# Patient Record
Sex: Female | Born: 1975 | Race: White | Hispanic: No | Smoking: Current every day smoker
Health system: Southern US, Community
[De-identification: ages and names within clinical notes are randomized; demographics above are authoritative.]

## PROBLEM LIST (undated history)

## (undated) ENCOUNTER — Inpatient Hospital Stay (HOSPITAL_COMMUNITY): Payer: Medicaid Other

## (undated) ENCOUNTER — Inpatient Hospital Stay (HOSPITAL_COMMUNITY): Payer: Self-pay

## (undated) DIAGNOSIS — O0990 Supervision of high risk pregnancy, unspecified, unspecified trimester: Secondary | ICD-10-CM

## (undated) DIAGNOSIS — O24419 Gestational diabetes mellitus in pregnancy, unspecified control: Secondary | ICD-10-CM

## (undated) DIAGNOSIS — O139 Gestational [pregnancy-induced] hypertension without significant proteinuria, unspecified trimester: Secondary | ICD-10-CM

## (undated) DIAGNOSIS — F329 Major depressive disorder, single episode, unspecified: Secondary | ICD-10-CM

## (undated) DIAGNOSIS — O1490 Unspecified pre-eclampsia, unspecified trimester: Secondary | ICD-10-CM

## (undated) DIAGNOSIS — K219 Gastro-esophageal reflux disease without esophagitis: Secondary | ICD-10-CM

## (undated) DIAGNOSIS — B999 Unspecified infectious disease: Secondary | ICD-10-CM

## (undated) DIAGNOSIS — F32A Depression, unspecified: Secondary | ICD-10-CM

## (undated) HISTORY — PX: DILATION AND CURETTAGE OF UTERUS: SHX78

## (undated) HISTORY — PX: THERAPEUTIC ABORTION: SHX798

## (undated) HISTORY — PX: WISDOM TOOTH EXTRACTION: SHX21

---

## 2011-11-18 ENCOUNTER — Encounter (HOSPITAL_COMMUNITY): Payer: Self-pay | Admitting: *Deleted

## 2011-11-18 ENCOUNTER — Emergency Department (HOSPITAL_COMMUNITY)
Admission: EM | Admit: 2011-11-18 | Discharge: 2011-11-19 | Disposition: A | Discharge status: Home / Self Care | Payer: Medicaid Other | Attending: Emergency Medicine | Admitting: Emergency Medicine

## 2011-11-18 ENCOUNTER — Emergency Department (HOSPITAL_COMMUNITY): Payer: Medicaid Other

## 2011-11-18 DIAGNOSIS — N898 Other specified noninflammatory disorders of vagina: Secondary | ICD-10-CM | POA: Insufficient documentation

## 2011-11-18 DIAGNOSIS — R1031 Right lower quadrant pain: Secondary | ICD-10-CM | POA: Insufficient documentation

## 2011-11-18 DIAGNOSIS — N852 Hypertrophy of uterus: Secondary | ICD-10-CM | POA: Insufficient documentation

## 2011-11-18 DIAGNOSIS — O200 Threatened abortion: Secondary | ICD-10-CM

## 2011-11-18 HISTORY — DX: Gestational diabetes mellitus in pregnancy, unspecified control: O24.419

## 2011-11-18 HISTORY — DX: Supervision of high risk pregnancy, unspecified, unspecified trimester: O09.90

## 2011-11-18 HISTORY — DX: Unspecified pre-eclampsia, unspecified trimester: O14.90

## 2011-11-18 LAB — CBC
Hemoglobin: 11.1 g/dL — ABNORMAL LOW (ref 12.0–15.0)
MCHC: 35.7 g/dL (ref 30.0–36.0)
Platelets: 185 10*3/uL (ref 150–400)

## 2011-11-18 LAB — DIFFERENTIAL
Basophils Absolute: 0 10*3/uL (ref 0.0–0.1)
Basophils Relative: 0 % (ref 0–1)
Eosinophils Absolute: 0.3 10*3/uL (ref 0.0–0.7)
Monocytes Relative: 7 % (ref 3–12)
Neutro Abs: 4.8 10*3/uL (ref 1.7–7.7)
Neutrophils Relative %: 62 % (ref 43–77)

## 2011-11-18 LAB — ABO/RH: ABO/RH(D): O POS

## 2011-11-18 LAB — HCG, QUANTITATIVE, PREGNANCY: hCG, Beta Chain, Quant, S: 37441 m[IU]/mL — ABNORMAL HIGH (ref <5)

## 2011-11-18 LAB — BASIC METABOLIC PANEL
BUN: 7 mg/dL (ref 6–23)
Calcium: 9.6 mg/dL (ref 8.4–10.5)
GFR calc Af Amer: >90 mL/min (ref >90)
GFR calc non Af Amer: >90 mL/min (ref >90)
Potassium: 3.3 mEq/L — ABNORMAL LOW (ref 3.5–5.1)
Sodium: 135 mEq/L (ref 135–145)

## 2011-11-18 NOTE — ED Provider Notes (Signed)
History     CSN: [id number] Arrival date & time 11/18/11  2112 First MD Initiated Contact with Patient 11/18/11 2201      Chief Complaint  Patient presents with  . Abdominal Pain    HPI Patient presents to the emergency room with complaints of right lower abdominal pain. This is her seventh pregnancy. She has 2 children at home. The patient has had 2 prior miscarriages and one elective abortion. Patient states she started having sharp abdominal pain this evening. She denies any diarrhea, fever or vaginal bleeding. Patient states the pain is sharp and stabbing. It seems to come and go. She had normal bowel movement this morning. She has not tried taking anything for the pain. Patient states she has not seen an OB/GYN doctor here and has not established care with a physician in this area. LMP feb 8th.   Past Medical History  Diagnosis Date  . Gestational diabetes   . Preeclampsia   . High-risk pregnancy     Past Surgical History  Procedure Date  . Cesarean section     History reviewed. No pertinent family history.  History  Substance Use Topics  . Smoking status: Former Smoker    Quit date: 08/21/2011  . Smokeless tobacco: Not on file  . Alcohol Use: No    OB History    Grav Para Term Preterm Abortions TAB SAB Ect Mult Living   7 2              Review of Systems  All other systems reviewed and are negative.    Allergies  Flagyl  Home Medications   Current Outpatient Rx  Name Route Sig Dispense Refill  . ADULT MULTIVITAMIN W/MINERALS CH Oral Take 1 tablet by mouth daily.      BP 114/67  Pulse 87  Temp(Src) 99 F (37.2 C) (Oral)  Resp 16  SpO2 98%  LMP 08/21/2011  Physical Exam  Nursing note and vitals reviewed. Constitutional: She appears well-developed and well-nourished. No distress.  HENT:  Head: Normocephalic and atraumatic.  Right Ear: External ear normal.  Left Ear: External ear normal.  Eyes: Conjunctivae are normal. Right eye exhibits no  discharge. Left eye exhibits no discharge. No scleral icterus.  Neck: Neck supple. No tracheal deviation present.  Cardiovascular: Normal rate, regular rhythm and intact distal pulses.   Pulmonary/Chest: Effort normal and breath sounds normal. No stridor. No respiratory distress. She has no wheezes. She has no rales.  Abdominal: Soft. Bowel sounds are normal. She exhibits no distension. There is no tenderness. There is no rebound and no guarding.  Genitourinary: There is no rash or tenderness on the right labia. There is no rash or tenderness on the left labia. Uterus is enlarged. Cervix exhibits no discharge. Right adnexum displays no mass. Left adnexum displays no mass. No erythema or tenderness around the vagina. Bleeding: small amount of bleeding after swab used. No signs of injury around the vagina. No vaginal discharge found.  Musculoskeletal: She exhibits no edema and no tenderness.  Neurological: She is alert. She has normal strength. No sensory deficit. Cranial nerve deficit:  no gross defecits noted. She exhibits normal muscle tone. She displays no seizure activity. Coordination normal.  Skin: Skin is warm and dry. No rash noted.  Psychiatric: She has a normal mood and affect.    ED Course  Procedures (including critical care time)   Labs Reviewed  BASIC METABOLIC PANEL  CBC  DIFFERENTIAL  ABO/RH  HCG, QUANTITATIVE, PREGNANCY  No results found.   No diagnosis found.    MDM  Doubt appendicits.  Pt with benign exam.  Will check [place] to assess for IUP vs threatened ab versus ectopic.  Pt remains stable.       Celene Kras, MD 11/18/11 [phone]

## 2011-11-18 NOTE — ED Notes (Signed)
Reports onset of RLQ abd pain - interm - since approx 1900 this PM - describes as "sharp"; reports is currently 12 wks preg - P 2, G 7, Ab 4 (3 miscarriages, 1 abortion); denies vaginal bleeding nor urinary symptoms;  Reports "clear" vaginal d/c; however, reports this is "normal" for her

## 2011-11-18 NOTE — ED Notes (Signed)
Assisted MD with pelvic exam - culture for GC/Chlamydia sent to minilab; pt tolerated procedure well

## 2011-11-18 NOTE — ED Notes (Signed)
[redacted] weeks pregnant, no prenatal visits yet, no OB, c/o abd pain, onset 2 hrs ago, (denies: diarrhea, fever, bleeding, spotting or other sx), pt alert, NAD, calm, interactive, skin W&D, resps e/u, resps e/u, grimacing. Pinpoints pain to RLQ. Temp 99.0 oral. "Comes and goes, sharp and stabbing". Last ate 1730. Last BM this am (normal). No meds PTA. Still has appendix.

## 2011-11-19 ENCOUNTER — Emergency Department (HOSPITAL_COMMUNITY): Payer: Medicaid Other

## 2011-11-19 NOTE — Discharge Instructions (Signed)
Return to the ED with any concerns including vaginal bleeding and soaking more than one pad per hour, worsening pain, fainting, decreased level of alertness/lethargy, or any other alarming symptoms

## 2011-11-19 NOTE — ED Notes (Signed)
Radiology called to see why [place] has not resulted.  Dondra Spry in radiology is passing message on to radiologist.

## 2011-11-19 NOTE — ED Provider Notes (Signed)
Pt signed out to me at change of shift, she had her pelvic ultrasound- but no results as of yet.  Have called and radiology will read for [place].    4:02 AM  Ultrasound result obtained- shows live IUP c/w LMP.  Pt discharged.    Ethelda Chick, MD 11/19/11 [phone]

## 2011-12-25 LAB — OB RESULTS CONSOLE ABO/RH: RH Type: POSITIVE

## 2011-12-25 LAB — OB RESULTS CONSOLE HEPATITIS B SURFACE ANTIGEN: Hepatitis B Surface Ag: NEGATIVE

## 2011-12-25 LAB — OB RESULTS CONSOLE RUBELLA ANTIBODY, IGM: Rubella: IMMUNE

## 2011-12-25 LAB — OB RESULTS CONSOLE ANTIBODY SCREEN: Antibody Screen: NEGATIVE

## 2011-12-25 LAB — OB RESULTS CONSOLE HIV ANTIBODY (ROUTINE TESTING): HIV: NONREACTIVE

## 2012-05-03 ENCOUNTER — Encounter (HOSPITAL_COMMUNITY): Payer: Self-pay | Admitting: *Deleted

## 2012-05-03 ENCOUNTER — Inpatient Hospital Stay (HOSPITAL_COMMUNITY)
Admission: AD | Admit: 2012-05-03 | Discharge: 2012-05-03 | Disposition: A | Discharge status: Hospice | Payer: Medicaid Other | Source: Ambulatory Visit | Attending: Obstetrics and Gynecology | Admitting: Obstetrics and Gynecology

## 2012-05-03 DIAGNOSIS — O479 False labor, unspecified: Secondary | ICD-10-CM | POA: Insufficient documentation

## 2012-05-03 HISTORY — DX: Gastro-esophageal reflux disease without esophagitis: K21.9

## 2012-05-03 MED ORDER — ZOLPIDEM TARTRATE 5 MG PO TABS
5.0000 mg | ORAL_TABLET | Freq: Once | ORAL | Status: AC
  Administered 2012-05-03: 5 mg via ORAL
  Filled 2012-05-03: qty 1

## 2012-05-03 NOTE — MAU Note (Signed)
Pt states is hving contractions that are painful-is a repeat C/S-scheduled for 05-16-12

## 2012-05-04 NOTE — Progress Notes (Signed)
FHT from 10-22 reviewed.  Reactive NST, no significant decels, irreg ctx

## 2012-05-12 ENCOUNTER — Encounter (HOSPITAL_COMMUNITY): Payer: Self-pay

## 2012-05-13 ENCOUNTER — Encounter (HOSPITAL_COMMUNITY)
Admission: RE | Admit: 2012-05-13 | Discharge: 2012-05-13 | Disposition: A | Discharge status: Home / Self Care | Payer: Medicaid Other | Source: Ambulatory Visit | Attending: Obstetrics and Gynecology | Admitting: Obstetrics and Gynecology

## 2012-05-13 ENCOUNTER — Encounter (HOSPITAL_COMMUNITY): Payer: Self-pay

## 2012-05-13 LAB — CBC
HCT: 30.8 % — ABNORMAL LOW (ref 36.0–46.0)
Hemoglobin: 10.2 g/dL — ABNORMAL LOW (ref 12.0–15.0)
MCHC: 33.1 g/dL (ref 30.0–36.0)
MCV: 89.8 fL (ref 78.0–100.0)
RDW: 13.7 % (ref 11.5–15.5)

## 2012-05-13 LAB — TYPE AND SCREEN: Antibody Screen: NEGATIVE

## 2012-05-13 NOTE — Patient Instructions (Addendum)
20 Dana [name]  05/13/2012   Your procedure is scheduled on:  05/16/12  Enter through the Main Entrance of [facility] at 1100 AM.  Pick up the phone at the desk and dial [phone].   Call this number if you have problems the morning of surgery: [phone]   Remember:   Do not eat food:After Midnight.  Do not drink clear liquids: After Midnight.  Take these medicines the morning of surgery with A SIP OF WATER: Protonix   Do not wear jewelry, make-up or nail polish.  Do not wear lotions, powders, or perfumes. You may wear deodorant.  Do not shave 48 hours prior to surgery.  Do not bring valuables to the hospital.  Contacts, dentures or bridgework may not be worn into surgery.  Leave suitcase in the car. After surgery it may be brought to your room.  For patients admitted to the hospital, checkout time is 11:00 AM the day of discharge.   Patients discharged the day of surgery will not be allowed to drive home.  Name and phone number of your driver: NA  Special Instructions: Shower using CHG 2 nights before surgery and the night before surgery.  If you shower the day of surgery use CHG.  Use special wash - you have one bottle of CHG for all showers.  You should use approximately 1/3 of the bottle for each shower.   Please read over the following fact sheets that you were given: MRSA Information 20 Dana [name]  05/13/2012   Your procedure is scheduled on:  11/4  Enter through the Main Entrance of [facility] at 1100  Pick up the phone at the desk and dial [phone].   Call this number if you have problems the morning of surgery: [phone]   Remember:   Do not eat food:After Midnight.  Do not drink clear liquids: After Midnight.  Take these medicines the morning of surgery with A SIP OF WATER: Protonix   Do not wear jewelry, make-up or nail polish.  Do not wear lotions, powders, or perfumes. You may wear deodorant.  Do not shave 48 hours prior to surgery.  Do not bring  valuables to the hospital.  Contacts, dentures or bridgework may not be worn into surgery.  Leave suitcase in the car. After surgery it may be brought to your room.  For patients admitted to the hospital, checkout time is 11:00 AM the day of discharge.   Patients discharged the day of surgery will not be allowed to drive home.  Name and phone number of your driver: v  Special Instructions: Shower using CHG 2 nights before surgery and the night before surgery.  If you shower the day of surgery use CHG.  Use special wash - you have one bottle of CHG for all showers.  You should use approximately 1/3 of the bottle for each shower.   Please read over the following fact sheets that you were given: MRSA Information

## 2012-05-16 ENCOUNTER — Encounter (HOSPITAL_COMMUNITY): Payer: Self-pay | Admitting: Anesthesiology

## 2012-05-16 ENCOUNTER — Inpatient Hospital Stay (HOSPITAL_COMMUNITY)
Admission: AD | Admit: 2012-05-16 | Discharge: 2012-05-19 | DRG: 766 | Disposition: A | Discharge status: Home / Self Care | Payer: Medicaid Other | Source: Ambulatory Visit | Attending: Obstetrics and Gynecology | Admitting: Obstetrics and Gynecology

## 2012-05-16 ENCOUNTER — Inpatient Hospital Stay (HOSPITAL_COMMUNITY): Payer: Medicaid Other | Admitting: Anesthesiology

## 2012-05-16 ENCOUNTER — Encounter (HOSPITAL_COMMUNITY): Payer: Self-pay | Admitting: *Deleted

## 2012-05-16 ENCOUNTER — Encounter (HOSPITAL_COMMUNITY)
Admission: AD | Disposition: A | Discharge status: Home / Self Care | Payer: Self-pay | Source: Ambulatory Visit | Attending: Obstetrics and Gynecology

## 2012-05-16 DIAGNOSIS — O321XX0 Maternal care for breech presentation, not applicable or unspecified: Secondary | ICD-10-CM | POA: Diagnosis present

## 2012-05-16 DIAGNOSIS — O99892 Other specified diseases and conditions complicating childbirth: Secondary | ICD-10-CM | POA: Diagnosis present

## 2012-05-16 DIAGNOSIS — O34219 Maternal care for unspecified type scar from previous cesarean delivery: Principal | ICD-10-CM | POA: Diagnosis present

## 2012-05-16 DIAGNOSIS — Z01812 Encounter for preprocedural laboratory examination: Secondary | ICD-10-CM

## 2012-05-16 DIAGNOSIS — Z98891 History of uterine scar from previous surgery: Secondary | ICD-10-CM

## 2012-05-16 DIAGNOSIS — Z01818 Encounter for other preprocedural examination: Secondary | ICD-10-CM

## 2012-05-16 DIAGNOSIS — Z22330 Carrier of Group B streptococcus: Secondary | ICD-10-CM

## 2012-05-16 DIAGNOSIS — Z3490 Encounter for supervision of normal pregnancy, unspecified, unspecified trimester: Secondary | ICD-10-CM

## 2012-05-16 SURGERY — Surgical Case
Anesthesia: Spinal | Site: Abdomen | Wound class: Clean Contaminated

## 2012-05-16 MED ORDER — OXYTOCIN 10 UNIT/ML IJ SOLN
40.0000 [IU] | INTRAVENOUS | Status: DC | PRN
  Administered 2012-05-16: 40 [IU] via INTRAVENOUS

## 2012-05-16 MED ORDER — SCOPOLAMINE 1 MG/3DAYS TD PT72
1.0000 | MEDICATED_PATCH | Freq: Once | TRANSDERMAL | Status: DC
  Administered 2012-05-16: 1.5 mg via TRANSDERMAL

## 2012-05-16 MED ORDER — SIMETHICONE 80 MG PO CHEW: 80.0000 mg | CHEWABLE_TABLET | ORAL | Status: DC | PRN

## 2012-05-16 MED ORDER — IBUPROFEN 600 MG PO TABS
600.0000 mg | ORAL_TABLET | Freq: Four times a day (QID) | ORAL | Status: DC | PRN
  Administered 2012-05-19: 600 mg via ORAL
  Filled 2012-05-16 (×6): qty 1

## 2012-05-16 MED ORDER — DIPHENHYDRAMINE HCL 25 MG PO CAPS: 25.0000 mg | ORAL_CAPSULE | Freq: Four times a day (QID) | ORAL | Status: DC | PRN

## 2012-05-16 MED ORDER — LACTATED RINGERS IV SOLN
INTRAVENOUS | Status: DC | PRN
  Administered 2012-05-16: 13:00:00 via INTRAVENOUS

## 2012-05-16 MED ORDER — KETOROLAC TROMETHAMINE 30 MG/ML IJ SOLN
30.0000 mg | Freq: Four times a day (QID) | INTRAMUSCULAR | Status: AC | PRN
  Administered 2012-05-16: 30 mg via INTRAVENOUS
  Filled 2012-05-16: qty 1

## 2012-05-16 MED ORDER — ONDANSETRON HCL 4 MG/2ML IJ SOLN: 4.0000 mg | INTRAMUSCULAR | Status: DC | PRN

## 2012-05-16 MED ORDER — FENTANYL CITRATE 0.05 MG/ML IJ SOLN: 25.0000 ug | INTRAMUSCULAR | Status: DC | PRN

## 2012-05-16 MED ORDER — SIMETHICONE 80 MG PO CHEW
80.0000 mg | CHEWABLE_TABLET | Freq: Three times a day (TID) | ORAL | Status: DC
  Administered 2012-05-16 – 2012-05-19 (×10): 80 mg via ORAL

## 2012-05-16 MED ORDER — ONDANSETRON HCL 4 MG/2ML IJ SOLN
INTRAMUSCULAR | Status: DC | PRN
  Administered 2012-05-16: 4 mg via INTRAVENOUS

## 2012-05-16 MED ORDER — LANOLIN HYDROUS EX OINT: 1.0000 "application " | TOPICAL_OINTMENT | CUTANEOUS | Status: DC | PRN

## 2012-05-16 MED ORDER — PRENATAL MULTIVITAMIN CH
1.0000 | ORAL_TABLET | Freq: Every day | ORAL | Status: DC
  Administered 2012-05-17 – 2012-05-19 (×3): 1 via ORAL
  Filled 2012-05-16 (×3): qty 1

## 2012-05-16 MED ORDER — NALBUPHINE HCL 10 MG/ML IJ SOLN
5.0000 mg | INTRAMUSCULAR | Status: DC | PRN
  Administered 2012-05-17: 10 mg via INTRAVENOUS
  Filled 2012-05-16: qty 1

## 2012-05-16 MED ORDER — DIBUCAINE 1 % RE OINT: 1.0000 "application " | TOPICAL_OINTMENT | RECTAL | Status: DC | PRN

## 2012-05-16 MED ORDER — KETOROLAC TROMETHAMINE 60 MG/2ML IM SOLN
INTRAMUSCULAR | Status: AC
  Administered 2012-05-16: 60 mg via INTRAMUSCULAR
  Filled 2012-05-16: qty 2

## 2012-05-16 MED ORDER — ONDANSETRON HCL 4 MG/2ML IJ SOLN
INTRAMUSCULAR | Status: AC
  Filled 2012-05-16: qty 2

## 2012-05-16 MED ORDER — ONDANSETRON HCL 4 MG/2ML IJ SOLN: 4.0000 mg | Freq: Three times a day (TID) | INTRAMUSCULAR | Status: DC | PRN

## 2012-05-16 MED ORDER — MEASLES, MUMPS & RUBELLA VAC ~~LOC~~ INJ: 0.5000 mL | INJECTION | Freq: Once | SUBCUTANEOUS | Status: DC

## 2012-05-16 MED ORDER — SODIUM CHLORIDE 0.9 % IV SOLN
1.0000 ug/kg/h | INTRAVENOUS | Status: DC | PRN
  Filled 2012-05-16: qty 2.5

## 2012-05-16 MED ORDER — OXYCODONE-ACETAMINOPHEN 5-325 MG PO TABS
1.0000 | ORAL_TABLET | ORAL | Status: DC | PRN
  Administered 2012-05-17 (×4): 1 via ORAL
  Administered 2012-05-17 – 2012-05-19 (×8): 2 via ORAL
  Filled 2012-05-16: qty 2
  Filled 2012-05-16: qty 1
  Filled 2012-05-16 (×2): qty 2
  Filled 2012-05-16 (×2): qty 1
  Filled 2012-05-16 (×4): qty 2
  Filled 2012-05-16: qty 1
  Filled 2012-05-16: qty 2

## 2012-05-16 MED ORDER — KETOROLAC TROMETHAMINE 60 MG/2ML IM SOLN
60.0000 mg | Freq: Once | INTRAMUSCULAR | Status: AC | PRN
  Administered 2012-05-16: 60 mg via INTRAMUSCULAR

## 2012-05-16 MED ORDER — BUPIVACAINE IN DEXTROSE 0.75-8.25 % IT SOLN
INTRATHECAL | Status: DC | PRN
  Administered 2012-05-16: 11.75 mg via INTRATHECAL

## 2012-05-16 MED ORDER — OXYTOCIN 40 UNITS IN LACTATED RINGERS INFUSION - SIMPLE MED: 62.5000 mL/h | INTRAVENOUS | Status: AC

## 2012-05-16 MED ORDER — MEPERIDINE HCL 25 MG/ML IJ SOLN
INTRAMUSCULAR | Status: AC
  Administered 2012-05-16: 6.25 mg via INTRAVENOUS
  Filled 2012-05-16: qty 1

## 2012-05-16 MED ORDER — MORPHINE SULFATE 0.5 MG/ML IJ SOLN
INTRAMUSCULAR | Status: AC
  Filled 2012-05-16: qty 10

## 2012-05-16 MED ORDER — MENTHOL 3 MG MT LOZG: 1.0000 | LOZENGE | OROMUCOSAL | Status: DC | PRN

## 2012-05-16 MED ORDER — PHENYLEPHRINE 40 MCG/ML (10ML) SYRINGE FOR IV PUSH (FOR BLOOD PRESSURE SUPPORT)
PREFILLED_SYRINGE | INTRAVENOUS | Status: AC
  Filled 2012-05-16: qty 5

## 2012-05-16 MED ORDER — LACTATED RINGERS IV SOLN
INTRAVENOUS | Status: DC
  Administered 2012-05-16: 125 mL/h via INTRAVENOUS
  Administered 2012-05-16 (×3): via INTRAVENOUS

## 2012-05-16 MED ORDER — MORPHINE SULFATE (PF) 0.5 MG/ML IJ SOLN
INTRAMUSCULAR | Status: DC | PRN
  Administered 2012-05-16: .1 mg via INTRATHECAL

## 2012-05-16 MED ORDER — FENTANYL CITRATE 0.05 MG/ML IJ SOLN
INTRAMUSCULAR | Status: AC
  Filled 2012-05-16: qty 2

## 2012-05-16 MED ORDER — SCOPOLAMINE 1 MG/3DAYS TD PT72
MEDICATED_PATCH | TRANSDERMAL | Status: AC
  Filled 2012-05-16: qty 1

## 2012-05-16 MED ORDER — KETOROLAC TROMETHAMINE 30 MG/ML IJ SOLN: 30.0000 mg | Freq: Four times a day (QID) | INTRAMUSCULAR | Status: AC | PRN

## 2012-05-16 MED ORDER — FENTANYL CITRATE 0.05 MG/ML IJ SOLN
INTRAMUSCULAR | Status: DC | PRN
  Administered 2012-05-16: 15 ug via INTRATHECAL

## 2012-05-16 MED ORDER — NALOXONE HCL 0.4 MG/ML IJ SOLN: 0.4000 mg | INTRAMUSCULAR | Status: DC | PRN

## 2012-05-16 MED ORDER — CEFAZOLIN SODIUM-DEXTROSE 2-3 GM-% IV SOLR
2.0000 g | Freq: Once | INTRAVENOUS | Status: AC
  Administered 2012-05-16: 2 g via INTRAVENOUS
  Filled 2012-05-16: qty 50

## 2012-05-16 MED ORDER — METOCLOPRAMIDE HCL 5 MG/ML IJ SOLN: 10.0000 mg | Freq: Three times a day (TID) | INTRAMUSCULAR | Status: DC | PRN

## 2012-05-16 MED ORDER — SCOPOLAMINE 1 MG/3DAYS TD PT72: 1.0000 | MEDICATED_PATCH | Freq: Once | TRANSDERMAL | Status: DC

## 2012-05-16 MED ORDER — SODIUM CHLORIDE 0.9 % IJ SOLN: 3.0000 mL | INTRAMUSCULAR | Status: DC | PRN

## 2012-05-16 MED ORDER — ONDANSETRON HCL 4 MG PO TABS: 4.0000 mg | ORAL_TABLET | ORAL | Status: DC | PRN

## 2012-05-16 MED ORDER — DIPHENHYDRAMINE HCL 25 MG PO CAPS
25.0000 mg | ORAL_CAPSULE | ORAL | Status: DC | PRN
  Filled 2012-05-16: qty 1

## 2012-05-16 MED ORDER — SENNOSIDES-DOCUSATE SODIUM 8.6-50 MG PO TABS
2.0000 | ORAL_TABLET | Freq: Every day | ORAL | Status: DC
  Administered 2012-05-16 – 2012-05-18 (×3): 2 via ORAL

## 2012-05-16 MED ORDER — MAGNESIUM HYDROXIDE 400 MG/5ML PO SUSP: 30.0000 mL | ORAL | Status: DC | PRN

## 2012-05-16 MED ORDER — PHENYLEPHRINE HCL 10 MG/ML IJ SOLN
INTRAMUSCULAR | Status: DC | PRN
  Administered 2012-05-16 (×4): 40 ug via INTRAVENOUS

## 2012-05-16 MED ORDER — IBUPROFEN 600 MG PO TABS
600.0000 mg | ORAL_TABLET | Freq: Four times a day (QID) | ORAL | Status: DC
  Administered 2012-05-17 – 2012-05-18 (×7): 600 mg via ORAL
  Filled 2012-05-16 (×3): qty 1

## 2012-05-16 MED ORDER — NALBUPHINE HCL 10 MG/ML IJ SOLN
5.0000 mg | INTRAMUSCULAR | Status: DC | PRN
  Administered 2012-05-16 – 2012-05-17 (×3): 10 mg via SUBCUTANEOUS
  Filled 2012-05-16 (×4): qty 1

## 2012-05-16 MED ORDER — ZOLPIDEM TARTRATE 5 MG PO TABS: 5.0000 mg | ORAL_TABLET | Freq: Every evening | ORAL | Status: DC | PRN

## 2012-05-16 MED ORDER — MEPERIDINE HCL 25 MG/ML IJ SOLN
6.2500 mg | INTRAMUSCULAR | Status: DC | PRN
  Administered 2012-05-16: 6.25 mg via INTRAVENOUS

## 2012-05-16 MED ORDER — DIPHENHYDRAMINE HCL 50 MG/ML IJ SOLN: 12.5000 mg | INTRAMUSCULAR | Status: DC | PRN

## 2012-05-16 MED ORDER — TETANUS-DIPHTH-ACELL PERTUSSIS 5-2.5-18.5 LF-MCG/0.5 IM SUSP
0.5000 mL | Freq: Once | INTRAMUSCULAR | Status: AC
  Administered 2012-05-17: 0.5 mL via INTRAMUSCULAR
  Filled 2012-05-16: qty 0.5

## 2012-05-16 MED ORDER — DIPHENHYDRAMINE HCL 50 MG/ML IJ SOLN: 25.0000 mg | INTRAMUSCULAR | Status: DC | PRN

## 2012-05-16 MED ORDER — OXYTOCIN 10 UNIT/ML IJ SOLN
INTRAMUSCULAR | Status: AC
  Filled 2012-05-16: qty 4

## 2012-05-16 MED ORDER — CEFAZOLIN SODIUM-DEXTROSE 2-3 GM-% IV SOLR
INTRAVENOUS | Status: AC
  Filled 2012-05-16: qty 50

## 2012-05-16 MED ORDER — WITCH HAZEL-GLYCERIN EX PADS: 1.0000 "application " | MEDICATED_PAD | CUTANEOUS | Status: DC | PRN

## 2012-05-16 MED ORDER — LACTATED RINGERS IV SOLN
INTRAVENOUS | Status: DC
  Administered 2012-05-16: 19:00:00 via INTRAVENOUS

## 2012-05-16 MED ORDER — NALBUPHINE SYRINGE 5 MG/0.5 ML
INJECTION | INTRAMUSCULAR | Status: AC
  Administered 2012-05-16: 10 mg via SUBCUTANEOUS
  Filled 2012-05-16: qty 1

## 2012-05-16 SURGICAL SUPPLY — 27 items
BENZOIN TINCTURE PRP APPL 2/3 (GAUZE/BANDAGES/DRESSINGS) ×2 IMPLANT
CLOTH BEACON ORANGE TIMEOUT ST (SAFETY) ×2 IMPLANT
DRSG COVADERM 4X10 (GAUZE/BANDAGES/DRESSINGS) ×2 IMPLANT
DURAPREP 26ML APPLICATOR (WOUND CARE) ×2 IMPLANT
ELECT REM PT RETURN 9FT ADLT (ELECTROSURGICAL) ×2
ELECTRODE REM PT RTRN 9FT ADLT (ELECTROSURGICAL) ×1 IMPLANT
GLOVE BIO SURGEON STRL SZ7.5 (GLOVE) ×2 IMPLANT
GLOVE BIO SURGEON STRL SZ8 (GLOVE) ×2 IMPLANT
GLOVE ORTHO TXT STRL SZ7.5 (GLOVE) ×2 IMPLANT
GOWN PREVENTION PLUS LG XLONG (DISPOSABLE) ×2 IMPLANT
GOWN STRL REIN XL XLG (GOWN DISPOSABLE) ×4 IMPLANT
NS IRRIG 1000ML POUR BTL (IV SOLUTION) ×2 IMPLANT
PACK C SECTION WH (CUSTOM PROCEDURE TRAY) ×2 IMPLANT
PAD OB MATERNITY 4.3X12.25 (PERSONAL CARE ITEMS) ×2 IMPLANT
RTRCTR C-SECT PINK 25CM LRG (MISCELLANEOUS) ×2 IMPLANT
STRIP CLOSURE SKIN 1/2X4 (GAUZE/BANDAGES/DRESSINGS) ×2 IMPLANT
SUT CHROMIC 1 CTX 36 (SUTURE) ×4 IMPLANT
SUT PLAIN 2 0 (SUTURE) ×1
SUT PLAIN ABS 2-0 CT1 27XMFL (SUTURE) ×1 IMPLANT
SUT VIC AB 0 CT1 27 (SUTURE) ×2
SUT VIC AB 0 CT1 27XBRD ANBCTR (SUTURE) ×2 IMPLANT
SUT VIC AB 3-0 SH 27 (SUTURE) ×1
SUT VIC AB 3-0 SH 27XBRD (SUTURE) ×1 IMPLANT
SUT VIC AB 4-0 KS 27 (SUTURE) ×2 IMPLANT
TOWEL OR 17X24 6PK STRL BLUE (TOWEL DISPOSABLE) ×4 IMPLANT
TRAY FOLEY CATH 14FR (SET/KITS/TRAYS/PACK) ×2 IMPLANT
WATER STERILE IRR 1000ML POUR (IV SOLUTION) ×2 IMPLANT

## 2012-05-16 NOTE — Transfer of Care (Signed)
Immediate Anesthesia Transfer of Care Note  Patient: Dana [name]  Procedure(s) Performed: Procedure(s) (LRB) with comments: CESAREAN SECTION (N/A)  Patient Location: PACU  Anesthesia Type:Spinal  Level of Consciousness: awake, alert  and oriented  Airway & Oxygen Therapy: Patient Spontanous Breathing  Post-op Assessment: Report given to PACU RN and Post -op Vital signs reviewed and stable  Post vital signs: Reviewed and stable  Complications: No apparent anesthesia complications

## 2012-05-16 NOTE — H&P (Signed)
Dana [name] is a 36 y.o. female,  G7 P1142, EGA [redacted] weeks with [facility] 11-9 presenting for repeat c-section.  Previous c-section x 2, declines VBAC.  Prenatal care essentially uncomplicated, except she was in jail from 29-33 weeks for missing a court date.  Previous h/o GDM with nl testing this pregnancy.  Previous h/o preeclampsia requiring delivery at 34 weeks, nl BP with this pregnancy.  See prenatal records for complete history.  Maternal Medical History:  Fetal activity: Perceived fetal activity is normal.    Prenatal complications: no prenatal complications   OB History    Grav Para Term Preterm Abortions TAB SAB Ect Mult Living   7 2 1 1 4 1 3   2     c-section at 34 weeks for preeclampsia c-section at 39 weeks, repeat, GDM SAB x 3, Eab x 1  Past Medical History  Diagnosis Date  . High-risk pregnancy   . Preeclampsia     History only with first preg 2006  . Gestational diabetes     History with previous pregnancy 2009  . GERD (gastroesophageal reflux disease)     ocasional with pregnancy only   Past Surgical History  Procedure Date  . Cesarean section   . Wisdom tooth extraction    Family History: family history includes Heart disease in her father and paternal grandfather. Social History:  reports that she quit smoking about 8 months ago. Her smoking use included Cigarettes. She has a 7.5 pack-year smoking history. She has never used smokeless tobacco. She reports that she does not drink alcohol or use illicit drugs.   Prenatal Transfer Tool  Maternal Diabetes: No Genetic Screening: Normal Maternal Ultrasounds/Referrals: Normal Fetal Ultrasounds or other Referrals:  None Maternal Substance Abuse:  No Significant Maternal Medications:  None Significant Maternal Lab Results:  Lab values include: Group B Strep positive Other Comments:  repeat c-section  Review of Systems  Respiratory: Negative.   Cardiovascular: Negative.       Blood pressure 132/88, pulse 75,  temperature 98.4 F (36.9 C), temperature source Oral, resp. rate 18, height 5\' 4"  (1.626 m), weight 69.4 kg (153 lb), last menstrual period 08/21/2011, SpO2 98.00%. Maternal Exam:  Abdomen: Patient reports no abdominal tenderness. Surgical scars: low transverse.   Estimated fetal weight is 7 1/2 lbs.   Fetal presentation: vertex  Introitus: Normal vulva. Normal vagina.  Pelvis: adequate for delivery.   Cervix: Cervix evaluated by digital exam.     Physical Exam  Constitutional: She appears well-developed and well-nourished.  Neck: Neck supple. No thyromegaly present.  Cardiovascular: Normal rate, regular rhythm and normal heart sounds.   No murmur heard. Respiratory: Effort normal and breath sounds normal. No respiratory distress. She has no wheezes.  GI: Soft.       Gravid     Prenatal labs: ABO, Rh: --/--/O POS (11/01 1405) Antibody: NEG (11/01 1358) Rubella: Immune (06/14 0000) RPR: NON REACTIVE (11/01 1354)  HBsAg: Negative (06/14 0000)  HIV: Non-reactive (06/14 0000)  GBS:   positive GTT:  normal  Assessment/Plan: IUP at 39 weeks with previous c-section x 2, being admitted for repeat c-section.  The procedure and risks have been discussed.     [name] D 05/16/2012, 11:52 AM

## 2012-05-16 NOTE — Anesthesia Procedure Notes (Signed)
Spinal  Patient location during procedure: OR Start time: 05/16/2012 12:23 PM Staffing Performed by: anesthesiologist  Preanesthetic Checklist Completed: patient identified, site marked, surgical consent, pre-op evaluation, timeout performed, IV checked, risks and benefits discussed and monitors and equipment checked Spinal Block Patient position: sitting Prep: site prepped and draped and DuraPrep Patient monitoring: heart rate, cardiac monitor, continuous pulse ox and blood pressure Approach: midline Location: L3-4 Injection technique: single-shot Needle Needle type: Sprotte  Needle gauge: 24 G Needle length: 9 cm Assessment Sensory level: T4 Additional Notes Clear free flow CSF on first attempt.  No paresthesia.  Patient tolerated procedure well.  Jasmine December, MD

## 2012-05-16 NOTE — Op Note (Addendum)
Preoperative diagnosis: Intrauterine pregnancy at 39 weeks, previous cesarean section x 2 Postoperative diagnosis: Same and breech presentation Procedure: Repeat low transverse cesarean section without extensions Surgeon: Lavina Hamman M.D. Assistant:  Tracey Harries, MD Anesthesia: Spinal Findings: Patient had normal gravid anatomy and delivered a viable female infant with Apgars of 9 and 9, weight pending Estimated blood loss: 800 cc Specimens: Placenta sent to labor and delivery Complications: None  Procedure in detail: The patient was taken to the operating room and placed in the sitting position. Dr. Rodman Pickle instilled spinal anesthesia.  She was then placed in the dorsosupine position with left tilt. Abdomen was then prepped and draped in the usual sterile fashion. A Foley catheter was placed. The level of her anesthesia was found to be adequate. Abdomen was entered via a standard Pfannenstiel incision through her previous scar. Once the peritoneal cavity was entered the Alexis disposable self-retaining retractor was placed and good visualization was achieved. A bladder flap was created due to previous adhesions to release the bladder inferiorly.  A 4 cm transverse incision was then made in the lower uterine segment pushing the bladder inferior. Once the uterine cavity was entered the incision was extended digitally. The fetus was found to be in a double footling breech presentation.  The feet were grasped, and with gentle traction the baby was delivered to the shoulders.  The right arm cam out with the legs, the left arm was swept across the chest.  The head then easily delivered. Mouth and nares were suctioned. Cord was doubly clamped and cut and the infant handed to the awaiting pediatric team. Cord blood was obtained. The placenta delivered spontaneously. Uterus was wiped dry with clean lap pad and all clots and debris were removed. Uterine incision was inspected and found to be free of  extensions. Uterine incision was closed in 1 layer with  running locking layer #1 chromic. Tubes and ovaries were inspected and found to be normal. Uterine incision was inspected and found to be hemostatic. Bleeding from serosal edges was controlled with electrocautery. The Alexis retractor was removed. Subfascial space was irrigated and made hemostatic with electrocautery.  Rectus muscles were reaproximated with #1 Chromic due to a wide diastasis.  Fascia was closed in running fashion starting at both ends and meeting in the middle with 0 Vicryl. Subcutaneous tissue was then irrigated and made hemostatic with electrocautery and closed with 2-0 plain gut. Skin was closed with running 4-0 Vicryl subcuticular suture followed by a sterile dressing. Patient tolerated the procedure well and was taken to the recovery in stable condition. Counts were correct x2, she received Ancef 2 g IV at the beginning of the procedure and she had PAS hose on throughout the procedure.

## 2012-05-16 NOTE — Anesthesia Preprocedure Evaluation (Signed)
Anesthesia Evaluation  Patient identified by MRN, date of birth, ID band Patient awake    Reviewed: Allergy & Precautions, H&P , NPO status , Patient's Chart, lab work & pertinent test results, reviewed documented beta blocker date and time   History of Anesthesia Complications Negative for: history of anesthetic complications  Airway Mallampati: III TM Distance: >3 FB Neck ROM: full    Dental  (+) Teeth Intact   Pulmonary former smoker,  breath sounds clear to auscultation        Cardiovascular negative cardio ROS  Rhythm:regular Rate:Normal     Neuro/Psych negative neurological ROS  negative psych ROS   GI/Hepatic Neg liver ROS, GERD- (with pregnancy)  Medicated,  Endo/Other  negative endocrine ROSneg diabetes (h/o GDM in second pregnancy, not currently)  Renal/GU negative Renal ROS     Musculoskeletal   Abdominal   Peds  Hematology negative hematology ROS (+)   Anesthesia Other Findings   Reproductive/Obstetrics (+) Pregnancy (h/o of c/s x2)                           Anesthesia Physical Anesthesia Plan  ASA: II  Anesthesia Plan: Spinal   Post-op Pain Management:    Induction:   Airway Management Planned:   Additional Equipment:   Intra-op Plan:   Post-operative Plan:   Informed Consent: I have reviewed the patients History and Physical, chart, labs and discussed the procedure including the risks, benefits and alternatives for the proposed anesthesia with the patient or authorized representative who has indicated his/her understanding and acceptance.     Plan Discussed with: Surgeon and CRNA  Anesthesia Plan Comments:         Anesthesia Quick Evaluation

## 2012-05-16 NOTE — Interval H&P Note (Signed)
History and Physical Interval Note:  05/16/2012 12:03 PM  Dana [name]  has presented today for surgery, with the diagnosis of previous c/s  The various methods of treatment have been discussed with the patient and family. After consideration of risks, benefits and other options for treatment, the patient has consented to  Procedure(s) (LRB) with comments: CESAREAN SECTION (N/A) - Repeat as a surgical intervention .  The patient's history has been reviewed, patient examined, no change in status, stable for surgery.  I have reviewed the patient's chart and labs.  Questions were answered to the patient's satisfaction.     Lazer Wollard D

## 2012-05-16 NOTE — OR Nursing (Signed)
Placenta to OR Refrigerator. 

## 2012-05-16 NOTE — Anesthesia Postprocedure Evaluation (Signed)
Anesthesia Post Note  Patient: Dana [name]  Procedure(s) Performed: Procedure(s) (LRB): CESAREAN SECTION (N/A)  Anesthesia type: Spinal  Patient location: PACU  Post pain: Pain level controlled  Post assessment: Post-op Vital signs reviewed  Last Vitals:  Filed Vitals:   05/16/12 1400  BP: 145/77  Pulse: 54  Temp:   Resp: 16    Post vital signs: Reviewed  Level of consciousness: awake  Complications: No apparent anesthesia complications

## 2012-05-17 ENCOUNTER — Encounter (HOSPITAL_COMMUNITY): Payer: Self-pay | Admitting: Obstetrics and Gynecology

## 2012-05-17 LAB — CBC
Hemoglobin: 8.6 g/dL — ABNORMAL LOW (ref 12.0–15.0)
Platelets: 200 10*3/uL (ref 150–400)
RBC: 2.87 MIL/uL — ABNORMAL LOW (ref 3.87–5.11)
WBC: 10.1 10*3/uL (ref 4.0–10.5)

## 2012-05-17 NOTE — Addendum Note (Signed)
Addendum  created 05/17/12 1427 by Algis Greenhouse, CRNA   Modules edited:Notes Section

## 2012-05-17 NOTE — Anesthesia Postprocedure Evaluation (Signed)
Anesthesia Post Note  Patient: Dana [name]  Procedure(s) Performed: Procedure(s) (LRB): CESAREAN SECTION (N/A)  Anesthesia type: Spinal  Patient location: Mother/Baby  Post pain: Pain level controlled  Post assessment: Post-op Vital signs reviewed  Last Vitals:  Filed Vitals:   05/17/12 0655  BP: 122/74  Pulse: 68  Temp: 36.7 C  Resp: 18    Post vital signs: Reviewed  Level of consciousness: awake  Complications: No apparent anesthesia complications

## 2012-05-17 NOTE — Progress Notes (Signed)
UR Chart review completed.  

## 2012-05-17 NOTE — Progress Notes (Signed)
Subjective: Postpartum Day #1: Cesarean Delivery Patient reports incisional pain, tolerating PO and no problems voiding.    Objective: Vital signs in last 24 hours: Temp:  [97.4 F (36.3 C)-98.3 F (36.8 C)] 98.1 F (36.7 C) (11/05 0655) Pulse Rate:  [50-119] 68  (11/05 0655) Resp:  [16-28] 18  (11/05 0655) BP: (116-166)/(57-88) 122/74 mmHg (11/05 0655) SpO2:  [97 %-100 %] 98 % (11/05 0238)  Physical Exam:  General: alert Lochia: appropriate Uterine Fundus: firm Incision: dressing C/D/I   Basename 05/17/12 0525  HGB 8.6*  HCT 26.0*    Assessment/Plan: Status post Cesarean section. Doing well postoperatively.  Continue current care, ambulate.  Dana [name] D 05/17/2012, 11:23 AM

## 2012-05-19 MED ORDER — OXYCODONE-ACETAMINOPHEN 5-325 MG PO TABS: 1.0000 | ORAL_TABLET | ORAL | Status: DC | PRN

## 2012-05-19 MED ORDER — IBUPROFEN 600 MG PO TABS: 600.0000 mg | ORAL_TABLET | Freq: Four times a day (QID) | ORAL | Status: DC | PRN

## 2012-05-19 NOTE — Discharge Summary (Signed)
Obstetric Discharge Summary Reason for Admission: cesarean section Prenatal Procedures: none Intrapartum Procedures: cesarean: low cervical, transverse Postpartum Procedures: none Complications-Operative and Postpartum: none Hemoglobin  Date Value Range Status  05/17/2012 8.6* 12.0 - 15.0 g/dL Final     HCT  Date Value Range Status  05/17/2012 26.0* 36.0 - 46.0 % Final    Physical Exam:  General: alert Lochia: appropriate Uterine Fundus: firm Incision: healing well  Discharge Diagnoses: Term Pregnancy-delivered  Discharge Information: Date: 05/19/2012 Activity: pelvic rest and no strenuous activity Diet: routine Medications: Ibuprofen and Percocet Condition: stable Instructions: refer to practice specific booklet Discharge to: home Follow-up Information    Follow up with Dana Putnam D, MD. Schedule an appointment as soon as possible for a visit in 2 weeks.   Contact information:   [address] [place] [address] [phone]          Newborn Data: Live born female  Birth Weight: 8 lb 6.2 oz (3805 g) APGAR: 9, 9  Home with mother.  Dana [name] 05/19/2012, 8:25 AM
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
## 2012-05-19 NOTE — Progress Notes (Signed)
POD #3 I saw her yesterday, apparently I neglected to write a note, there were no problems No problems today Afeb, VSS Abd- soft, fundus firm, incision intact D/c home

## 2013-02-10 ENCOUNTER — Emergency Department (HOSPITAL_COMMUNITY)
Admission: EM | Admit: 2013-02-10 | Discharge: 2013-02-10 | Disposition: A | Discharge status: Home / Self Care | Payer: Self-pay | Attending: Emergency Medicine | Admitting: Emergency Medicine

## 2013-02-10 ENCOUNTER — Encounter (HOSPITAL_COMMUNITY): Payer: Self-pay | Admitting: Emergency Medicine

## 2013-02-10 DIAGNOSIS — K0381 Cracked tooth: Secondary | ICD-10-CM | POA: Insufficient documentation

## 2013-02-10 DIAGNOSIS — K089 Disorder of teeth and supporting structures, unspecified: Secondary | ICD-10-CM | POA: Insufficient documentation

## 2013-02-10 DIAGNOSIS — Z8719 Personal history of other diseases of the digestive system: Secondary | ICD-10-CM | POA: Insufficient documentation

## 2013-02-10 DIAGNOSIS — K047 Periapical abscess without sinus: Secondary | ICD-10-CM

## 2013-02-10 DIAGNOSIS — K029 Dental caries, unspecified: Secondary | ICD-10-CM | POA: Insufficient documentation

## 2013-02-10 DIAGNOSIS — Z79899 Other long term (current) drug therapy: Secondary | ICD-10-CM | POA: Insufficient documentation

## 2013-02-10 DIAGNOSIS — Z8632 Personal history of gestational diabetes: Secondary | ICD-10-CM | POA: Insufficient documentation

## 2013-02-10 DIAGNOSIS — Z87891 Personal history of nicotine dependence: Secondary | ICD-10-CM | POA: Insufficient documentation

## 2013-02-10 DIAGNOSIS — R220 Localized swelling, mass and lump, head: Secondary | ICD-10-CM | POA: Insufficient documentation

## 2013-02-10 DIAGNOSIS — H9209 Otalgia, unspecified ear: Secondary | ICD-10-CM | POA: Insufficient documentation

## 2013-02-10 DIAGNOSIS — K044 Acute apical periodontitis of pulpal origin: Secondary | ICD-10-CM | POA: Insufficient documentation

## 2013-02-10 MED ORDER — HYDROCODONE-ACETAMINOPHEN 5-325 MG PO TABS
2.0000 | ORAL_TABLET | Freq: Once | ORAL | Status: AC
  Administered 2013-02-10: 2 via ORAL
  Filled 2013-02-10: qty 2

## 2013-02-10 MED ORDER — PENICILLIN V POTASSIUM 500 MG PO TABS: 500.0000 mg | ORAL_TABLET | Freq: Four times a day (QID) | ORAL | Status: AC

## 2013-02-10 MED ORDER — HYDROCODONE-ACETAMINOPHEN 5-325 MG PO TABS: 1.0000 | ORAL_TABLET | ORAL | Status: DC | PRN

## 2013-02-10 NOTE — ED Provider Notes (Signed)
Medical screening examination/treatment/procedure(s) were performed by non-physician practitioner and as supervising physician I was immediately available for consultation/collaboration.  Ethelda Chick, MD 02/10/13 [phone]

## 2013-02-10 NOTE — ED Notes (Signed)
Patient present to ED with worsening left dental pain radiating to left ear starting yesterday. Patient reports that a piece of a tooth on her upper left side broke off in October and has had intermittent pain since then, but that yesterday the pain came on significantly worse than in the past and did not go away. Patient reports that pain has prevented her from eating and sleeping. Some swelling visualized over left zygomatic arch.

## 2013-02-10 NOTE — ED Provider Notes (Signed)
CSN: [id number]     Arrival date & time 02/10/13  1902 History     First MD Initiated Contact with Patient 02/10/13 1906     Chief Complaint  Patient presents with  . Dental Pain   (Consider location/radiation/quality/duration/timing/severity/associated sxs/prior Treatment) Patient is a 37 y.o. female presenting with tooth pain. The history is provided by the patient and medical records. No language interpreter was used.  Dental Pain Associated symptoms: facial swelling   Associated symptoms: no drooling, no fever, no headaches and no neck pain     Dana [name] is a 37 y.o. female  with a hx of GERD presents to the Emergency Department complaining of gradual, persistent, progressively worsening L upper molar pain beginning 2 days.  Pt states she broke the tooth last month but has not had any pain in the tooth until yesterday.  Pt has taken ibuprofen and tylenol without relief. Associated symptoms include upper jaw pain, left ear pain, subjective facial swelling.  Nothing makes it better and eating, drinking and cold air makes it worse.  Pt denies fever, chills, headache, neck pain, chest pain, shortness of breath abdominal pain, nausea, vomiting, diarrhea weakness, dizziness, syncope..     Past Medical History  Diagnosis Date  . High-risk pregnancy   . Preeclampsia     History only with first preg 2006  . Gestational diabetes     History with previous pregnancy 2009  . GERD (gastroesophageal reflux disease)     ocasional with pregnancy only   Past Surgical History  Procedure Laterality Date  . Cesarean section    . Wisdom tooth extraction    . Cesarean section  05/16/2012    Procedure: CESAREAN SECTION;  Surgeon: Lavina Hamman, MD;  Location: [facility];  Service: Obstetrics;  Laterality: N/A;   Family History  Problem Relation Age of Onset  . Heart disease Father   . Heart disease Paternal Grandfather    History  Substance Use Topics  . Smoking status: Former Smoker --  0.50 packs/day for 15 years    Types: Cigarettes    Quit date: 08/21/2011  . Smokeless tobacco: Never Used  . Alcohol Use: No   OB History   Grav Para Term Preterm Abortions TAB SAB Ect Mult Living   7 3 2 1 4 1 3   3      Review of Systems  Constitutional: Negative for fever, chills and appetite change.  HENT: Positive for ear pain (left), facial swelling and dental problem. Negative for nosebleeds, rhinorrhea, drooling, trouble swallowing, neck pain, neck stiffness and postnasal drip.   Eyes: Negative for pain and redness.  Respiratory: Negative for cough and wheezing.   Cardiovascular: Negative for chest pain.  Gastrointestinal: Negative for nausea, vomiting and abdominal pain.  Skin: Negative for color change and rash.  Neurological: Negative for weakness, light-headedness and headaches.  All other systems reviewed and are negative.    Allergies  Flagyl  Home Medications   Current Outpatient Rx  Name  Route  Sig  Dispense  Refill  . Aspirin-Acetaminophen (GOODYS BODY PAIN PO)   Oral   Take 2 packets by mouth.         . citalopram (CELEXA) 40 MG tablet   Oral   Take 60 mg by mouth daily.         Marland Kitchen ibuprofen (ADVIL,MOTRIN) 200 MG tablet   Oral   Take 800 mg by mouth every 6 (six) hours as needed for pain.         Marland Kitchen  HYDROcodone-acetaminophen (NORCO/VICODIN) 5-325 MG per tablet   Oral   Take 1-2 tablets by mouth every 4 (four) hours as needed for pain.   21 tablet   0   . penicillin v potassium (VEETID) 500 MG tablet   Oral   Take 1 tablet (500 mg total) by mouth 4 (four) times daily.   40 tablet   0    BP 129/84  Pulse 67  Temp(Src) 98.3 F (36.8 C) (Oral)  Resp 14  SpO2 98% Physical Exam  Nursing note and vitals reviewed. Constitutional: She appears well-developed and well-nourished.  HENT:  Head: Normocephalic and atraumatic.  Right Ear: Tympanic membrane, external ear and ear canal normal.  Left Ear: Tympanic membrane, external ear and ear  canal normal.  Nose: Nose normal. No mucosal edema or rhinorrhea. Right sinus exhibits no maxillary sinus tenderness and no frontal sinus tenderness. Left sinus exhibits no maxillary sinus tenderness and no frontal sinus tenderness.  Mouth/Throat: Uvula is midline, oropharynx is clear and moist and mucous membranes are normal. No oral lesions. Abnormal dentition. Dental caries present. No dental abscesses, edematous or lacerations. No oropharyngeal exudate, posterior oropharyngeal edema, posterior oropharyngeal erythema or tonsillar abscesses.    No visible facial swelling Tooth #14 broken with visible caries Mild erythema of the gingiva No gross abscess  Eyes: Conjunctivae are normal. Pupils are equal, round, and reactive to light. Right eye exhibits no discharge. Left eye exhibits no discharge.  Neck: Normal range of motion. Neck supple.  Cardiovascular: Normal rate, regular rhythm, normal heart sounds and intact distal pulses.   No murmur heard. Pulmonary/Chest: Effort normal and breath sounds normal. No respiratory distress. She has no wheezes.  Abdominal: Soft. Bowel sounds are normal. She exhibits no distension. There is no tenderness.  Lymphadenopathy:    She has no cervical adenopathy.  Neurological: She is alert. She exhibits normal muscle tone. Coordination normal.  Skin: Skin is warm and dry. No erythema.  Psychiatric: She has a normal mood and affect.    ED Course   Procedures (including critical care time)  Labs Reviewed - No data to display No results found. 1. Pain due to dental caries   2. Dental infection     MDM  Dana [name] presents with broken tooth and dental pain.  No gross abscess.  Exam unconcerning for Ludwig's angina or spread of infection.  Will treat with penicillin and pain medicine.  Urged patient to follow-up with dentist.  I have also discussed reasons to return immediately to the ER.  Patient expresses understanding and agrees with  plan.     Dierdre Forth, PA-C 02/10/13 1937

## 2013-03-22 ENCOUNTER — Encounter (HOSPITAL_COMMUNITY): Payer: Self-pay | Admitting: *Deleted

## 2013-03-22 ENCOUNTER — Inpatient Hospital Stay (HOSPITAL_COMMUNITY): Payer: Medicaid Other

## 2013-03-22 ENCOUNTER — Inpatient Hospital Stay (HOSPITAL_COMMUNITY)
Admission: AD | Admit: 2013-03-22 | Discharge: 2013-03-22 | Disposition: A | Discharge status: Home / Self Care | Payer: Medicaid Other | Source: Ambulatory Visit | Attending: Obstetrics and Gynecology | Admitting: Obstetrics and Gynecology

## 2013-03-22 DIAGNOSIS — O4690 Antepartum hemorrhage, unspecified, unspecified trimester: Secondary | ICD-10-CM

## 2013-03-22 DIAGNOSIS — O26859 Spotting complicating pregnancy, unspecified trimester: Secondary | ICD-10-CM | POA: Insufficient documentation

## 2013-03-22 DIAGNOSIS — O209 Hemorrhage in early pregnancy, unspecified: Secondary | ICD-10-CM

## 2013-03-22 HISTORY — DX: Gestational (pregnancy-induced) hypertension without significant proteinuria, unspecified trimester: O13.9

## 2013-03-22 HISTORY — DX: Depression, unspecified: F32.A

## 2013-03-22 HISTORY — DX: Unspecified infectious disease: B99.9

## 2013-03-22 HISTORY — DX: Major depressive disorder, single episode, unspecified: F32.9

## 2013-03-22 LAB — URINALYSIS, ROUTINE W REFLEX MICROSCOPIC
Bilirubin Urine: NEGATIVE
Glucose, UA: NEGATIVE mg/dL
Ketones, ur: NEGATIVE mg/dL
Protein, ur: NEGATIVE mg/dL

## 2013-03-22 LAB — CBC WITH DIFFERENTIAL/PLATELET
Basophils Absolute: 0 10*3/uL (ref 0.0–0.1)
Basophils Relative: 0 % (ref 0–1)
Eosinophils Relative: 3 % (ref 0–5)
HCT: 35.6 % — ABNORMAL LOW (ref 36.0–46.0)
Lymphocytes Relative: 23 % (ref 12–46)
MCHC: 34.8 g/dL (ref 30.0–36.0)
MCV: 90.1 fL (ref 78.0–100.0)
Monocytes Absolute: 0.5 10*3/uL (ref 0.1–1.0)
Monocytes Relative: 7 % (ref 3–12)
RDW: 13.3 % (ref 11.5–15.5)

## 2013-03-22 LAB — WET PREP, GENITAL
Clue Cells Wet Prep HPF POC: NONE SEEN
Trich, Wet Prep: NONE SEEN
Yeast Wet Prep HPF POC: NONE SEEN

## 2013-03-22 LAB — URINE MICROSCOPIC-ADD ON

## 2013-03-22 NOTE — MAU Provider Note (Signed)
History     CSN: [id number]  Arrival date and time: 03/22/13 1240   None     Chief Complaint  Patient presents with  . Possible Pregnancy  . Vaginal Bleeding   HPI Dana [name] is 37 y.o. [phone] [redacted]w[redacted]d weeks presenting with spotting that began at 9am and now it is heavier.  Denies blood clots. She is a patient of Dr. Eligha Bridegroom.  She delivered Nov 2013.  Not using contraception.  Vomited this am for the first time.  A "little" dizzy.    Past Medical History  Diagnosis Date  . High-risk pregnancy   . Preeclampsia     History only with first preg 2006  . GERD (gastroesophageal reflux disease)     ocasional with pregnancy only  . Pregnancy induced hypertension     first preg  . Gestational diabetes     History with previous pregnancy 2009  . Preterm labor     1st delivery  . Infection     UTI  . Depression     on meds- doing well    Past Surgical History  Procedure Laterality Date  . Cesarean section    . Wisdom tooth extraction    . Cesarean section  05/16/2012    Procedure: CESAREAN SECTION;  Surgeon: Lavina Hamman, MD;  Location: [facility];  Service: Obstetrics;  Laterality: N/A;  . Dilation and curettage of uterus    . Therapeutic abortion      Family History  Problem Relation Age of Onset  . Heart disease Father   . Heart disease Paternal Grandfather     History  Substance Use Topics  . Smoking status: Former Smoker -- 0.50 packs/day for 15 years    Types: Cigarettes    Quit date: 08/21/2011  . Smokeless tobacco: Never Used  . Alcohol Use: No    Allergies:  Allergies  Allergen Reactions  . Flagyl [Metronidazole]     rash    Prescriptions prior to admission  Medication Sig Dispense Refill  . acetaminophen (TYLENOL) 500 MG tablet Take 1,000 mg by mouth daily as needed for pain.      . ARIPiprazole (ABILIFY) 2 MG tablet Take 2 mg by mouth daily.      . citalopram (CELEXA) 40 MG tablet Take 60 mg by mouth daily.        Review of Systems   Constitutional: Negative for fever and chills.  Gastrointestinal: Positive for vomiting (x1 today). Negative for abdominal pain.  Genitourinary: Negative for dysuria, urgency, frequency and hematuria.       + for vaginal bleeding  Neurological: Negative for headaches.   Physical Exam   Blood pressure 132/75, pulse 87, temperature 98.5 F (36.9 C), temperature source Oral, resp. rate 18, height 5\' 4"  (1.626 m), weight 131 lb 12.8 oz (59.784 kg), last menstrual period 02/11/2013.  Physical Exam  Constitutional: She is oriented to person, place, and time. She appears well-developed and well-nourished. No distress.  HENT:  Head: Normocephalic.  Neck: Normal range of motion.  Cardiovascular: Normal rate.   Respiratory: Effort normal.  GI: Soft. She exhibits no distension and no mass. There is no tenderness. There is no rebound and no guarding.  Genitourinary: There is no rash, tenderness or lesion on the right labia. There is no rash, tenderness or lesion on the left labia. Uterus is not enlarged and not tender. Cervix exhibits no motion tenderness, no discharge and no friability. Right adnexum displays no mass, no tenderness and no fullness. Left  adnexum displays no mass, no tenderness and no fullness. There is bleeding (small amount of dark red blood with many small clots.   ) around the vagina. No erythema around the vagina. No vaginal discharge found.  Cervical os is closed  Neurological: She is alert and oriented to person, place, and time.  Skin: Skin is warm and dry.  Psychiatric: She has a normal mood and affect. Her behavior is normal. Thought content normal.   BLOOD TYPE from previous record is O POSTIVE  Results for orders placed during the hospital encounter of 03/22/13 (from the past 24 hour(s))  URINALYSIS, ROUTINE W REFLEX MICROSCOPIC     Status: Abnormal   Collection Time    03/22/13 12:45 PM      Result Value Range   Color, Urine YELLOW  YELLOW   APPearance CLEAR  CLEAR    Specific Gravity, Urine 1.015  1.005 - 1.030   pH 6.5  5.0 - 8.0   Glucose, UA NEGATIVE  NEGATIVE mg/dL   Hgb urine dipstick LARGE (*) NEGATIVE   Bilirubin Urine NEGATIVE  NEGATIVE   Ketones, ur NEGATIVE  NEGATIVE mg/dL   Protein, ur NEGATIVE  NEGATIVE mg/dL   Urobilinogen, UA 0.2  0.0 - 1.0 mg/dL   Nitrite NEGATIVE  NEGATIVE   Leukocytes, UA NEGATIVE  NEGATIVE  URINE MICROSCOPIC-ADD ON     Status: Abnormal   Collection Time    03/22/13 12:45 PM      Result Value Range   Squamous Epithelial / LPF RARE  RARE   WBC, UA 0-2  <3 WBC/hpf   RBC / HPF 11-20  <3 RBC/hpf   Bacteria, UA FEW (*) RARE  POCT PREGNANCY, URINE     Status: Abnormal   Collection Time    03/22/13 12:50 PM      Result Value Range   Preg Test, Ur POSITIVE (*) NEGATIVE  CBC WITH DIFFERENTIAL     Status: Abnormal   Collection Time    03/22/13  1:30 PM      Result Value Range   WBC 7.6  4.0 - 10.5 K/uL   RBC 3.95  3.87 - 5.11 MIL/uL   Hemoglobin 12.4  12.0 - 15.0 g/dL   HCT 16.1 (*) 09.6 - 04.5 %   MCV 90.1  78.0 - 100.0 fL   MCH 31.4  26.0 - 34.0 pg   MCHC 34.8  30.0 - 36.0 g/dL   RDW 40.9  81.1 - 91.4 %   Platelets 225  150 - 400 K/uL   Neutrophils Relative % 67  43 - 77 %   Neutro Abs 5.0  1.7 - 7.7 K/uL   Lymphocytes Relative 23  12 - 46 %   Lymphs Abs 1.7  0.7 - 4.0 K/uL   Monocytes Relative 7  3 - 12 %   Monocytes Absolute 0.5  0.1 - 1.0 K/uL   Eosinophils Relative 3  0 - 5 %   Eosinophils Absolute 0.2  0.0 - 0.7 K/uL   Basophils Relative 0  0 - 1 %   Basophils Absolute 0.0  0.0 - 0.1 K/uL  HCG, QUANTITATIVE, PREGNANCY     Status: Abnormal   Collection Time    03/22/13  1:30 PM      Result Value Range   hCG, Beta Chain, Quant, S [id number] (*) <5 mIU/mL  WET PREP, GENITAL     Status: Abnormal   Collection Time    03/22/13  1:38 PM  Result Value Range   Yeast Wet Prep HPF POC NONE SEEN  NONE SEEN   Trich, Wet Prep NONE SEEN  NONE SEEN   Clue Cells Wet Prep HPF POC NONE SEEN  NONE SEEN   WBC,  Wet Prep HPF POC FEW (*) NONE SEEN    US Ob Comp Less 14 Wks  03/22/2013   *RADIOLOGY REPORT*  Clinical Data: Bleeding today.  Previous C-section.  Quantitative beta HCG is pending.  By LMP, the patient is 5 weeks 4 days.  EDC by LMP is 11/18/2013.  OBSTETRIC <14 WK [place] AND TRANSVAGINAL OB US  Technique:  Both transabdominal and transvaginal ultrasound examinations were performed for complete evaluation of the gestation as well as the maternal uterus, adnexal regions, and pelvic cul-de-sac.  Transvaginal technique was performed to assess early pregnancy.  Comparison:  None this pregnancy  Intrauterine gestational sac:  Present Yolk sac: Present Embryo: Present Cardiac Activity: Present Heart Rate: 106 bpm  CRL: 2.7  mm  5 w  6 d        [place] EDC: 11/16/2013  Maternal uterus/adnexae: Right corpus luteum cyst.  Normal-appearing left ovary.  IMPRESSION:  1.  Single living intrauterine embryo correlating well with LMP dating. 2.  Right corpus luteum cyst.   Original Report Authenticated By: Norva Pavlov, M.D.   US Ob Transvaginal  03/22/2013   *RADIOLOGY REPORT*  Clinical Data: Bleeding today.  Previous C-section.  Quantitative beta HCG is pending.  By LMP, the patient is 5 weeks 4 days.  EDC by LMP is 11/18/2013.  OBSTETRIC <14 WK [place] AND TRANSVAGINAL OB US  Technique:  Both transabdominal and transvaginal ultrasound examinations were performed for complete evaluation of the gestation as well as the maternal uterus, adnexal regions, and pelvic cul-de-sac.  Transvaginal technique was performed to assess early pregnancy.  Comparison:  None this pregnancy  Intrauterine gestational sac:  Present Yolk sac: Present Embryo: Present Cardiac Activity: Present Heart Rate: 106 bpm  CRL: 2.7  mm  5 w  6 d        [place] EDC: 11/16/2013  Maternal uterus/adnexae: Right corpus luteum cyst.  Normal-appearing left ovary.  IMPRESSION:  1.  Single living intrauterine embryo correlating well with LMP dating. 2.  Right corpus luteum cyst.    Original Report Authenticated By: Norva Pavlov, M.D.  MAU Course  Procedures  GC/CHL culture to lab  MDM Reported HPI, physical exam findings, labs and U/S report to Dr. Senaida Ores.  Pelvic rest and make appt to begin prenatal care.    Assessment and Plan  A;  Vaginal bleeding in early pregnancy      Viable intrauterine pregnancy at [redacted]w[redacted]d  P:  Pelvic rest until bleeding stops      Call Dr. Jackelyn Knife to make appointment to begin prenatal care      Prenatal vitamins daily      Verification letter given   Matt Holmes 03/22/2013, 3:07 PM

## 2013-03-22 NOTE — MAU Note (Signed)
+  HPT on Saturday.  Started spotting this morning- bright red, now a little heavier

## 2013-03-31 ENCOUNTER — Inpatient Hospital Stay (HOSPITAL_COMMUNITY)
Admission: AD | Admit: 2013-03-31 | Discharge: 2013-03-31 | Disposition: A | Discharge status: Home / Self Care | Payer: Medicaid Other | Source: Ambulatory Visit | Attending: Obstetrics and Gynecology | Admitting: Obstetrics and Gynecology

## 2013-03-31 ENCOUNTER — Inpatient Hospital Stay (HOSPITAL_COMMUNITY): Payer: Medicaid Other

## 2013-03-31 ENCOUNTER — Encounter (HOSPITAL_COMMUNITY): Payer: Self-pay

## 2013-03-31 DIAGNOSIS — O209 Hemorrhage in early pregnancy, unspecified: Secondary | ICD-10-CM | POA: Insufficient documentation

## 2013-03-31 LAB — URINALYSIS, ROUTINE W REFLEX MICROSCOPIC
Bilirubin Urine: NEGATIVE
Glucose, UA: NEGATIVE mg/dL
Specific Gravity, Urine: <1.005 — ABNORMAL LOW (ref 1.005–1.030)
Urobilinogen, UA: 0.2 mg/dL (ref 0.0–1.0)

## 2013-03-31 LAB — URINE MICROSCOPIC-ADD ON

## 2013-03-31 NOTE — MAU Note (Signed)
Was seen here couple wks ago for bleeding. Today having more bleeding. Started cramping about 1800. Pain mostly LLQ.

## 2013-03-31 NOTE — MAU Note (Signed)
Patient is in with c/o continued spotting (she states that its dark blood, mostly noticed after voiding) and new onset of left lower quadrant sharp pain that started at 1800 today. She states that she have not started care at [facility] yet because of pending insurance approval. She did call their office and was advised to come to MAU for a repeat ultrasound in light of the persistent bleeding.

## 2013-03-31 NOTE — MAU Provider Note (Signed)
History     CSN: [id number]  Arrival date and time: 03/31/13 2052   First Provider Initiated Contact with Patient 03/31/13 2139      Chief Complaint  Patient presents with  . Abdominal Cramping  . Vaginal Bleeding   HPI Ms Punches is a 37yo [id number] at [redacted]w[redacted]d who presents for eval of vag bldg. She was seen in MAU on 9/10 for vag bldg and was noted at that time to have a [redacted]w[redacted]d IUP (GC/chlam/WP all neg; bld type O pos). She continued with seeing brown d/c with wiping for a few days, but then this morning she began to have dark red/brown bleeding that was heavier than previously along with intermittent pelvic cramping. Her OB hx is remarkable for prev C/S x 3 with most recent Nov 2013. Unable to sched OB appt with Dr Jackelyn Knife as no longer has MCD- working on this currently.  OB History   Grav Para Term Preterm Abortions TAB SAB Ect Mult Living   8 3 2 1 4 1 3   3       Past Medical History  Diagnosis Date  . High-risk pregnancy   . Preeclampsia     History only with first preg 2006  . GERD (gastroesophageal reflux disease)     ocasional with pregnancy only  . Pregnancy induced hypertension     first preg  . Gestational diabetes     History with previous pregnancy 2009  . Preterm labor     1st delivery  . Infection     UTI  . Depression     on meds- doing well    Past Surgical History  Procedure Laterality Date  . Cesarean section    . Wisdom tooth extraction    . Cesarean section  05/16/2012    Procedure: CESAREAN SECTION;  Surgeon: Lavina Hamman, MD;  Location: [facility];  Service: Obstetrics;  Laterality: N/A;  . Dilation and curettage of uterus    . Therapeutic abortion      Family History  Problem Relation Age of Onset  . Heart disease Father   . Heart disease Paternal Grandfather     History  Substance Use Topics  . Smoking status: Former Smoker -- 0.50 packs/day for 15 years    Types: Cigarettes    Quit date: 03/13/2013  . Smokeless tobacco: Never Used  .  Alcohol Use: No    Allergies:  Allergies  Allergen Reactions  . Flagyl [Metronidazole] Rash    Prescriptions prior to admission  Medication Sig Dispense Refill  . acetaminophen (TYLENOL) 500 MG tablet Take 1,000 mg by mouth daily as needed for pain.        ROS Physical Exam   Blood pressure 130/60, pulse 81, temperature 98 F (36.7 C), resp. rate 20, height 5\' 4"  (1.626 m), weight 62.143 kg (137 lb), last menstrual period 02/11/2013, SpO2 100.00%.  Physical Exam  Constitutional: She is oriented to person, place, and time. She appears well-developed.  HENT:  Head: Normocephalic.  Neck: Normal range of motion.  Cardiovascular: Normal rate.   Respiratory: Effort normal.  GI: Soft.  Genitourinary:  SE: dk brown blood noted in vault; uterus sl enlarged and NT; cx closed and NT  Musculoskeletal: Normal range of motion.  Neurological: She is alert and oriented to person, place, and time.  Skin: Skin is warm and dry.  Psychiatric: She has a normal mood and affect. Her behavior is normal. Thought content normal.   Urinalysis    Component Value  Date/Time   COLORURINE YELLOW 03/31/2013 2114   APPEARANCEUR CLEAR 03/31/2013 2114   LABSPEC <1.005* 03/31/2013 2114   PHURINE 6.0 03/31/2013 2114   GLUCOSEU NEGATIVE 03/31/2013 2114   HGBUR TRACE* 03/31/2013 2114   BILIRUBINUR NEGATIVE 03/31/2013 2114   KETONESUR NEGATIVE 03/31/2013 2114   PROTEINUR NEGATIVE 03/31/2013 2114   UROBILINOGEN 0.2 03/31/2013 2114   NITRITE NEGATIVE 03/31/2013 2114   LEUKOCYTESUR NEGATIVE 03/31/2013 2114   Intrauterine gestational sac: Visualized/normal in shape.  Yolk sac: Visualized  Embryo: Visualized  Cardiac Activity: Visualized  Heart Rate: 140  CRL: 11.7 mm 7 w 3 d [place] EDC: 11/14/2013  Maternal uterus/adnexae:  A small subchorionic hemorrhage is seen. The predominant  subchorionic collection measures 2.7 x 1.0 x 1.5 cm. The right  ovary contains a 2.4 x 1.9 x 2.0 cm cyst, likely a corpus luteum  cyst.  Left ovary is not definitely visualized today. No left  adnexal mass is seen. No free pelvic fluid is appreciated.   IMPRESSION:  1. Single living intrauterine pregnancy. Estimated gestational age  by today's ultrasound is concordant with assigned gestational age  of [redacted] weeks 6 days.  2. Small subchorionic hemorrhage.    MAU Course  Procedures   Assessment and Plan  IUP at 6.6wks Sm [facility] with vag spotting  D/C home- reassured re status at this time Pelvic rest until 7d after bldg stops Schedule OB appt with Dr Jackelyn Knife as soon as receives MCD   DAVETTA, OLLIFF 03/31/2013, 9:46 PM

## 2013-04-01 NOTE — MAU Provider Note (Signed)
Attestation of Attending Supervision of Advanced Practitioner: Evaluation and management procedures were performed by the PA/NP/CNM/OB Fellow under my supervision/collaboration. Chart reviewed and agree with management and plan.  Shenicka Sunderlin V 04/01/2013 12:27 AM

## 2013-05-01 LAB — OB RESULTS CONSOLE HIV ANTIBODY (ROUTINE TESTING): HIV: NONREACTIVE

## 2013-05-01 LAB — OB RESULTS CONSOLE RPR: RPR: NONREACTIVE

## 2013-05-01 LAB — OB RESULTS CONSOLE ANTIBODY SCREEN: Antibody Screen: NEGATIVE

## 2013-05-01 LAB — OB RESULTS CONSOLE ABO/RH: "RH Type ": POSITIVE

## 2013-05-01 LAB — OB RESULTS CONSOLE RUBELLA ANTIBODY, IGM: Rubella: IMMUNE

## 2013-05-01 LAB — OB RESULTS CONSOLE HEPATITIS B SURFACE ANTIGEN: Hepatitis B Surface Ag: NEGATIVE

## 2013-08-09 ENCOUNTER — Encounter: Payer: Medicaid Other | Attending: Obstetrics and Gynecology

## 2013-08-09 VITALS — Ht 64.0 in | Wt 147.1 lb

## 2013-08-09 DIAGNOSIS — Z713 Dietary counseling and surveillance: Secondary | ICD-10-CM | POA: Insufficient documentation

## 2013-08-09 DIAGNOSIS — O99810 Abnormal glucose complicating pregnancy: Secondary | ICD-10-CM | POA: Insufficient documentation

## 2013-08-15 NOTE — Progress Notes (Signed)
  Patient was seen on 08/09/13 for Gestational Diabetes self-management class at the [facility]. The following learning objectives were met by the patient during this course:   States the definition of Gestational Diabetes  States why dietary management is important in controlling blood glucose  Describes the effects of carbohydrates on blood glucose levels  Demonstrates ability to create a balanced meal plan  Demonstrates carbohydrate counting   States when to check blood glucose levels  Demonstrates proper blood glucose monitoring techniques  States the effect of stress and exercise on blood glucose levels  States the importance of limiting caffeine and abstaining from alcohol and smoking  Plan:  Aim for 2 Carb Choices per meal (30 grams) +/- 1 either way for breakfast Aim for 3 Carb Choices per meal (45 grams) +/- 1 either way from lunch and dinner Aim for 1-2 Carbs per snack Begin reading food labels for Total Carbohydrate and sugar grams of foods Consider  increasing your activity level by walking daily as tolerated Begin checking BG before breakfast and 1-2 hours after first bit of breakfast, lunch and dinner after  as directed by MD  Take medication  as directed by MD  Blood glucose monitor given: Accu Chek Nano BG Monitoring Kit Lot # [id number] Exp: 08/12/14 Blood glucose reading: 104  Patient instructed to monitor glucose levels: FBS: 60 - <90 1 hour: <140 2 hour: <120  Patient received the following handouts:  Nutrition Diabetes and Pregnancy  Carbohydrate Counting List  Meal Planning worksheet  Patient will be seen for follow-up as needed.

## 2013-11-09 ENCOUNTER — Encounter (HOSPITAL_COMMUNITY): Payer: Self-pay

## 2013-11-09 NOTE — Patient Instructions (Signed)
   Your procedure is scheduled on:  Monday, May 4  [facility] through [facility] at: 945 AM Pick up the phone at the desk and dial [phone] and inform us of your arrival.  Please call this number if you have any problems the morning of surgery: [phone]  Remember: Do not eat or drink after midnight: Sunday Take these medicines the morning of surgery with a SIP OF WATER:  Do not wear jewelry, make-up, or FINGER nail polish No metal in your hair or on your body. Do not wear lotions, powders, perfumes.  You may wear deodorant.  Do not bring valuables to the hospital. Contacts, dentures or bridgework may not be worn into surgery.  Leave suitcase in the car. After Surgery it may be brought to your room. For patients being admitted to the hospital, checkout time is 11:00am the day of discharge.

## 2013-11-10 ENCOUNTER — Encounter (HOSPITAL_COMMUNITY)
Admission: RE | Admit: 2013-11-10 | Discharge: 2013-11-10 | Disposition: A | Discharge status: Home / Self Care | Payer: Medicaid Other | Source: Ambulatory Visit | Attending: Obstetrics and Gynecology | Admitting: Obstetrics and Gynecology

## 2013-11-10 ENCOUNTER — Encounter (HOSPITAL_COMMUNITY): Payer: Self-pay

## 2013-11-10 ENCOUNTER — Encounter (HOSPITAL_COMMUNITY): Payer: Self-pay | Admitting: Pharmacy Technician

## 2013-11-10 LAB — CBC
HEMATOCRIT: 28.8 % — AB (ref 36.0–46.0)
Hemoglobin: 9.2 g/dL — ABNORMAL LOW (ref 12.0–15.0)
MCH: 27.1 pg (ref 26.0–34.0)
MCHC: 31.9 g/dL (ref 30.0–36.0)
MCV: 85 fL (ref 78.0–100.0)
Platelets: 225 10*3/uL (ref 150–400)
RBC: 3.39 MIL/uL — ABNORMAL LOW (ref 3.87–5.11)
RDW: 14.6 % (ref 11.5–15.5)
WBC: 9 10*3/uL (ref 4.0–10.5)

## 2013-11-10 LAB — TYPE AND SCREEN
ABO/RH(D): O POS
Antibody Screen: NEGATIVE

## 2013-11-10 LAB — RPR

## 2013-11-12 NOTE — H&P (Signed)
Dana ErichsenKimberly D [name] is a 38 y.o. female, G8 19P2143, EGA [redacted] weeks with EDC 5-9 presenting for repeat c-section.  She has had 3 previous c-sections.  Prenatal care complicated by GDM controlled with Lantus and pm NPH insulin, reactive NSTs, no other significant prenatal problems.  She has a h/o substance abuse and [facility]  Maternal Medical History:  Fetal activity: Perceived fetal activity is normal.    Prenatal Complications - Diabetes: gestational. Diabetes is managed by insulin injections.      OB History   Grav Para Term Preterm Abortions TAB SAB Ect Mult Living   8 3 2 1 4 1 3   3     c-section at 34 weeks for preeclampsia 2 c-sections at term SAB x 3, EAB x 1  Past Medical History  Diagnosis Date  . High-risk pregnancy   . Preeclampsia     History only with first preg 2006  . GERD (gastroesophageal reflux disease)     ocasional with pregnancy only  . Pregnancy induced hypertension     first preg  . Gestational diabetes     History with previous pregnancy 2009  . Preterm labor     1st delivery  . Infection     UTI  . Depression     on meds- doing well   Past Surgical History  Procedure Laterality Date  . Cesarean section    . Wisdom tooth extraction    . Cesarean section  05/16/2012    Procedure: CESAREAN SECTION;  Surgeon: Lavina Hammanodd Atsushi Yom, MD;  Location: [facility];  Service: Obstetrics;  Laterality: N/A;  . Dilation and curettage of uterus    . Therapeutic abortion     Family History: family history includes Heart disease in her father and paternal grandfather. Social History:  reports that she quit smoking about 8 months ago. Her smoking use included Cigarettes. She has a 7.5 pack-year smoking history. She has never used smokeless tobacco. She reports that she does not drink alcohol or use illicit drugs.   Prenatal Transfer Tool  Maternal Diabetes: Yes:  Diabetes Type:  Insulin/Medication controlled Genetic Screening: Normal Maternal  Ultrasounds/Referrals: Normal Fetal Ultrasounds or other Referrals:  None Maternal Substance Abuse:  No Significant Maternal Medications:  Meds include: Other:  Significant Maternal Lab Results:  Lab values include: Group B Strep positive Other Comments:  GDM controlled with Lantus and NPH insulin  Review of Systems  Respiratory: Negative.   Cardiovascular: Negative.       Last menstrual period 02/11/2013. Maternal Exam:  Abdomen: Surgical scars: low transverse.   Estimated fetal weight is 7 lbs.   Fetal presentation: vertex  Introitus: Normal vulva. Normal vagina.  Pelvis: adequate for delivery.   Cervix: Cervix evaluated by digital exam.     Physical Exam  Constitutional: She appears well-developed and well-nourished.  Neck: Neck supple. No thyromegaly present.  Cardiovascular: Normal rate, regular rhythm and normal heart sounds.   No murmur heard. Respiratory: Effort normal. No respiratory distress. She has no wheezes.  GI: Soft.  gravid    Prenatal labs: ABO, Rh: --/--/O POS (05/01 16100910) Antibody: NEG (05/01 0910) Rubella: Immune (10/20 0000) RPR: NON REAC (05/01 0910)  HBsAg: Negative (10/20 0000)  HIV: Non-reactive (10/20 0000)  GBS:   pos  Assessment/Plan: IUP at 39+ weeks for repeat c-section.  C-section procedure and risks have been discussed.  GDM has been well controlled with insulin.  Will admit for repeat c-section.  Dana [name] 11/12/2013, 8:18 PM

## 2013-11-13 ENCOUNTER — Encounter (HOSPITAL_COMMUNITY): Payer: Self-pay | Admitting: *Deleted

## 2013-11-13 ENCOUNTER — Encounter (HOSPITAL_COMMUNITY)
Admission: RE | Disposition: A | Discharge status: Home / Self Care | Payer: Self-pay | Source: Ambulatory Visit | Attending: Obstetrics and Gynecology

## 2013-11-13 ENCOUNTER — Inpatient Hospital Stay (HOSPITAL_COMMUNITY)
Admission: RE | Admit: 2013-11-13 | Discharge: 2013-11-16 | DRG: 765 | Disposition: A | Discharge status: Home / Self Care | Payer: Medicaid Other | Source: Ambulatory Visit | Attending: Obstetrics and Gynecology | Admitting: Obstetrics and Gynecology

## 2013-11-13 ENCOUNTER — Inpatient Hospital Stay (HOSPITAL_COMMUNITY)
Admission: AD | Admit: 2013-11-13 | Payer: Medicaid Other | Source: Ambulatory Visit | Admitting: Obstetrics and Gynecology

## 2013-11-13 ENCOUNTER — Inpatient Hospital Stay (HOSPITAL_COMMUNITY): Payer: Medicaid Other | Admitting: Anesthesiology

## 2013-11-13 ENCOUNTER — Encounter (HOSPITAL_COMMUNITY): Payer: Medicaid Other | Admitting: Anesthesiology

## 2013-11-13 DIAGNOSIS — O1002 Pre-existing essential hypertension complicating childbirth: Secondary | ICD-10-CM | POA: Diagnosis present

## 2013-11-13 DIAGNOSIS — K219 Gastro-esophageal reflux disease without esophagitis: Secondary | ICD-10-CM | POA: Diagnosis present

## 2013-11-13 DIAGNOSIS — D649 Anemia, unspecified: Secondary | ICD-10-CM | POA: Diagnosis present

## 2013-11-13 DIAGNOSIS — O34219 Maternal care for unspecified type scar from previous cesarean delivery: Principal | ICD-10-CM | POA: Diagnosis present

## 2013-11-13 DIAGNOSIS — Z794 Long term (current) use of insulin: Secondary | ICD-10-CM

## 2013-11-13 DIAGNOSIS — Z87891 Personal history of nicotine dependence: Secondary | ICD-10-CM

## 2013-11-13 DIAGNOSIS — O99814 Abnormal glucose complicating childbirth: Secondary | ICD-10-CM | POA: Diagnosis present

## 2013-11-13 DIAGNOSIS — O9989 Other specified diseases and conditions complicating pregnancy, childbirth and the puerperium: Secondary | ICD-10-CM

## 2013-11-13 DIAGNOSIS — O9902 Anemia complicating childbirth: Secondary | ICD-10-CM | POA: Diagnosis present

## 2013-11-13 DIAGNOSIS — Z98891 History of uterine scar from previous surgery: Secondary | ICD-10-CM

## 2013-11-13 DIAGNOSIS — O99892 Other specified diseases and conditions complicating childbirth: Secondary | ICD-10-CM | POA: Diagnosis present

## 2013-11-13 DIAGNOSIS — Z22330 Carrier of Group B streptococcus: Secondary | ICD-10-CM

## 2013-11-13 LAB — BASIC METABOLIC PANEL
BUN: 10 mg/dL (ref 6–23)
CALCIUM: 9.2 mg/dL (ref 8.4–10.5)
CO2: 21 mEq/L (ref 19–32)
CREATININE: 0.73 mg/dL (ref 0.50–1.10)
Chloride: 100 mEq/L (ref 96–112)
GFR calc non Af Amer: >90 mL/min (ref >90)
Glucose, Bld: 81 mg/dL (ref 70–99)
Potassium: 3.5 mEq/L — ABNORMAL LOW (ref 3.7–5.3)
Sodium: 138 mEq/L (ref 137–147)

## 2013-11-13 LAB — GLUCOSE, CAPILLARY
GLUCOSE-CAPILLARY: 81 mg/dL (ref 70–99)
Glucose-Capillary: 89 mg/dL (ref 70–99)
Glucose-Capillary: 82 mg/dL (ref 70–99)

## 2013-11-13 SURGERY — Surgical Case
Anesthesia: Spinal | Site: Abdomen

## 2013-11-13 MED ORDER — OXYTOCIN 10 UNIT/ML IJ SOLN
40.0000 [IU] | INTRAVENOUS | Status: DC | PRN
  Administered 2013-11-13: 12:00:00 via INTRAVENOUS
  Administered 2013-11-13: 400 mL via INTRAVENOUS

## 2013-11-13 MED ORDER — LACTATED RINGERS IV SOLN
Freq: Once | INTRAVENOUS | Status: AC
  Administered 2013-11-13: 11:00:00 via INTRAVENOUS

## 2013-11-13 MED ORDER — NALOXONE HCL 1 MG/ML IJ SOLN
1.0000 ug/kg/h | INTRAVENOUS | Status: DC | PRN
  Filled 2013-11-13: qty 2

## 2013-11-13 MED ORDER — MENTHOL 3 MG MT LOZG: 1.0000 | LOZENGE | OROMUCOSAL | Status: DC | PRN

## 2013-11-13 MED ORDER — ONDANSETRON HCL 4 MG/2ML IJ SOLN: 4.0000 mg | INTRAMUSCULAR | Status: DC | PRN

## 2013-11-13 MED ORDER — KETOROLAC TROMETHAMINE 30 MG/ML IJ SOLN: 30.0000 mg | Freq: Four times a day (QID) | INTRAMUSCULAR | Status: AC | PRN

## 2013-11-13 MED ORDER — DIPHENHYDRAMINE HCL 25 MG PO CAPS
25.0000 mg | ORAL_CAPSULE | ORAL | Status: DC | PRN
  Administered 2013-11-13: 25 mg via ORAL
  Filled 2013-11-13 (×2): qty 1

## 2013-11-13 MED ORDER — SCOPOLAMINE 1 MG/3DAYS TD PT72: 1.0000 | MEDICATED_PATCH | Freq: Once | TRANSDERMAL | Status: DC

## 2013-11-13 MED ORDER — NALBUPHINE HCL 10 MG/ML IJ SOLN: 5.0000 mg | INTRAMUSCULAR | Status: DC | PRN

## 2013-11-13 MED ORDER — ACETAMINOPHEN 160 MG/5ML PO SOLN
975.0000 mg | Freq: Four times a day (QID) | ORAL | Status: DC | PRN
Start: 2013-11-13 — End: 2013-11-16
  Filled 2013-11-13: qty 40.6

## 2013-11-13 MED ORDER — SIMETHICONE 80 MG PO CHEW
80.0000 mg | CHEWABLE_TABLET | Freq: Three times a day (TID) | ORAL | Status: DC
  Administered 2013-11-14 – 2013-11-16 (×8): 80 mg via ORAL
  Filled 2013-11-13 (×8): qty 1

## 2013-11-13 MED ORDER — OXYTOCIN 40 UNITS IN LACTATED RINGERS INFUSION - SIMPLE MED: 62.5000 mL/h | INTRAVENOUS | Status: AC

## 2013-11-13 MED ORDER — ONDANSETRON HCL 4 MG/2ML IJ SOLN
INTRAMUSCULAR | Status: DC | PRN
  Administered 2013-11-13: 4 mg via INTRAVENOUS

## 2013-11-13 MED ORDER — HYDROMORPHONE HCL PF 1 MG/ML IJ SOLN: 0.2500 mg | INTRAMUSCULAR | Status: DC | PRN

## 2013-11-13 MED ORDER — SCOPOLAMINE 1 MG/3DAYS TD PT72
MEDICATED_PATCH | TRANSDERMAL | Status: AC
  Administered 2013-11-13: 1.5 mg via TRANSDERMAL
  Filled 2013-11-13: qty 1

## 2013-11-13 MED ORDER — NALOXONE HCL 0.4 MG/ML IJ SOLN: 0.4000 mg | INTRAMUSCULAR | Status: DC | PRN

## 2013-11-13 MED ORDER — CEFAZOLIN SODIUM-DEXTROSE 2-3 GM-% IV SOLR
INTRAVENOUS | Status: AC
  Filled 2013-11-13: qty 50

## 2013-11-13 MED ORDER — MORPHINE SULFATE 0.5 MG/ML IJ SOLN
INTRAMUSCULAR | Status: AC
  Filled 2013-11-13: qty 10

## 2013-11-13 MED ORDER — SIMETHICONE 80 MG PO CHEW
80.0000 mg | CHEWABLE_TABLET | ORAL | Status: DC
  Administered 2013-11-14 – 2013-11-15 (×3): 80 mg via ORAL
  Filled 2013-11-13 (×3): qty 1

## 2013-11-13 MED ORDER — MEASLES, MUMPS & RUBELLA VAC ~~LOC~~ INJ
0.5000 mL | INJECTION | Freq: Once | SUBCUTANEOUS | Status: DC
  Filled 2013-11-13: qty 0.5

## 2013-11-13 MED ORDER — MEPERIDINE HCL 25 MG/ML IJ SOLN: 6.2500 mg | INTRAMUSCULAR | Status: DC | PRN

## 2013-11-13 MED ORDER — DIBUCAINE 1 % RE OINT: 1.0000 "application " | TOPICAL_OINTMENT | RECTAL | Status: DC | PRN

## 2013-11-13 MED ORDER — LACTATED RINGERS IV SOLN
INTRAVENOUS | Status: DC
Start: 2013-11-13 — End: 2013-11-13

## 2013-11-13 MED ORDER — ONDANSETRON HCL 4 MG/2ML IJ SOLN: 4.0000 mg | Freq: Three times a day (TID) | INTRAMUSCULAR | Status: DC | PRN

## 2013-11-13 MED ORDER — ZOLPIDEM TARTRATE 5 MG PO TABS: 5.0000 mg | ORAL_TABLET | Freq: Every evening | ORAL | Status: DC | PRN

## 2013-11-13 MED ORDER — METOCLOPRAMIDE HCL 5 MG/ML IJ SOLN: 10.0000 mg | Freq: Three times a day (TID) | INTRAMUSCULAR | Status: DC | PRN

## 2013-11-13 MED ORDER — 0.9 % SODIUM CHLORIDE (POUR BTL) OPTIME
TOPICAL | Status: DC | PRN
  Administered 2013-11-13: 1000 mL

## 2013-11-13 MED ORDER — TETANUS-DIPHTH-ACELL PERTUSSIS 5-2.5-18.5 LF-MCG/0.5 IM SUSP
0.5000 mL | Freq: Once | INTRAMUSCULAR | Status: AC
  Administered 2013-11-15: 0.5 mL via INTRAMUSCULAR
  Filled 2013-11-13: qty 0.5

## 2013-11-13 MED ORDER — ONDANSETRON HCL 4 MG/2ML IJ SOLN
INTRAMUSCULAR | Status: AC
  Filled 2013-11-13: qty 2

## 2013-11-13 MED ORDER — ONDANSETRON HCL 4 MG PO TABS: 4.0000 mg | ORAL_TABLET | ORAL | Status: DC | PRN

## 2013-11-13 MED ORDER — PHENYLEPHRINE 8 MG IN D5W 100 ML (0.08MG/ML) PREMIX OPTIME
INJECTION | INTRAVENOUS | Status: DC | PRN
  Administered 2013-11-13: 60 ug/min via INTRAVENOUS

## 2013-11-13 MED ORDER — LACTATED RINGERS IV SOLN
INTRAVENOUS | Status: DC
  Administered 2013-11-13 (×2): via INTRAVENOUS

## 2013-11-13 MED ORDER — KETOROLAC TROMETHAMINE 30 MG/ML IJ SOLN
30.0000 mg | Freq: Four times a day (QID) | INTRAMUSCULAR | Status: AC | PRN
  Administered 2013-11-13: 30 mg via INTRAMUSCULAR

## 2013-11-13 MED ORDER — LACTATED RINGERS IV SOLN
INTRAVENOUS | Status: DC
  Administered 2013-11-13: 22:00:00 via INTRAVENOUS

## 2013-11-13 MED ORDER — DIPHENHYDRAMINE HCL 50 MG/ML IJ SOLN: 25.0000 mg | INTRAMUSCULAR | Status: DC | PRN

## 2013-11-13 MED ORDER — FENTANYL CITRATE 0.05 MG/ML IJ SOLN
INTRAMUSCULAR | Status: DC | PRN
  Administered 2013-11-13: 25 ug via INTRATHECAL

## 2013-11-13 MED ORDER — NALBUPHINE HCL 10 MG/ML IJ SOLN
INTRAMUSCULAR | Status: AC
  Administered 2013-11-13: 10 mg via SUBCUTANEOUS
  Filled 2013-11-13: qty 1

## 2013-11-13 MED ORDER — CITALOPRAM HYDROBROMIDE 40 MG PO TABS
40.0000 mg | ORAL_TABLET | Freq: Every day | ORAL | Status: DC
  Administered 2013-11-14 – 2013-11-16 (×3): 40 mg via ORAL
  Filled 2013-11-13 (×5): qty 1

## 2013-11-13 MED ORDER — WITCH HAZEL-GLYCERIN EX PADS: 1.0000 "application " | MEDICATED_PAD | CUTANEOUS | Status: DC | PRN

## 2013-11-13 MED ORDER — DIPHENHYDRAMINE HCL 50 MG/ML IJ SOLN: 12.5000 mg | INTRAMUSCULAR | Status: DC | PRN

## 2013-11-13 MED ORDER — SENNOSIDES-DOCUSATE SODIUM 8.6-50 MG PO TABS
2.0000 | ORAL_TABLET | ORAL | Status: DC
  Administered 2013-11-14 – 2013-11-15 (×3): 2 via ORAL
  Filled 2013-11-13 (×3): qty 2

## 2013-11-13 MED ORDER — SIMETHICONE 80 MG PO CHEW: 80.0000 mg | CHEWABLE_TABLET | ORAL | Status: DC | PRN

## 2013-11-13 MED ORDER — PRENATAL MULTIVITAMIN CH
1.0000 | ORAL_TABLET | Freq: Every day | ORAL | Status: DC
  Administered 2013-11-14 – 2013-11-16 (×3): 1 via ORAL
  Filled 2013-11-13 (×3): qty 1

## 2013-11-13 MED ORDER — NALBUPHINE HCL 10 MG/ML IJ SOLN
5.0000 mg | INTRAMUSCULAR | Status: DC | PRN
  Administered 2013-11-13 – 2013-11-14 (×5): 10 mg via SUBCUTANEOUS
  Filled 2013-11-13 (×3): qty 1

## 2013-11-13 MED ORDER — FENTANYL CITRATE 0.05 MG/ML IJ SOLN
INTRAMUSCULAR | Status: AC
  Filled 2013-11-13: qty 2

## 2013-11-13 MED ORDER — PHENYLEPHRINE 8 MG IN D5W 100 ML (0.08MG/ML) PREMIX OPTIME
INJECTION | INTRAVENOUS | Status: AC
  Filled 2013-11-13: qty 100

## 2013-11-13 MED ORDER — SODIUM CHLORIDE 0.9 % IJ SOLN: 3.0000 mL | INTRAMUSCULAR | Status: DC | PRN

## 2013-11-13 MED ORDER — BUTORPHANOL TARTRATE 10 MG/ML NA SOLN
1.0000 | NASAL | Status: DC | PRN
  Administered 2013-11-13 – 2013-11-14 (×3): 1 via NASAL
  Filled 2013-11-13: qty 2.5

## 2013-11-13 MED ORDER — DIPHENHYDRAMINE HCL 25 MG PO CAPS: 25.0000 mg | ORAL_CAPSULE | Freq: Four times a day (QID) | ORAL | Status: DC | PRN

## 2013-11-13 MED ORDER — SCOPOLAMINE 1 MG/3DAYS TD PT72
1.0000 | MEDICATED_PATCH | Freq: Once | TRANSDERMAL | Status: DC
  Administered 2013-11-13: 1.5 mg via TRANSDERMAL

## 2013-11-13 MED ORDER — KETOROLAC TROMETHAMINE 30 MG/ML IJ SOLN
INTRAMUSCULAR | Status: AC
Start: 2013-11-13 — End: 2013-11-14
  Filled 2013-11-13: qty 1

## 2013-11-13 MED ORDER — OXYTOCIN 10 UNIT/ML IJ SOLN
INTRAMUSCULAR | Status: AC
  Filled 2013-11-13: qty 4

## 2013-11-13 MED ORDER — MORPHINE SULFATE (PF) 0.5 MG/ML IJ SOLN
INTRAMUSCULAR | Status: DC | PRN
  Administered 2013-11-13: .15 ug via INTRATHECAL

## 2013-11-13 MED ORDER — IBUPROFEN 600 MG PO TABS
600.0000 mg | ORAL_TABLET | Freq: Four times a day (QID) | ORAL | Status: DC
  Administered 2013-11-13 – 2013-11-16 (×12): 600 mg via ORAL
  Filled 2013-11-13 (×12): qty 1

## 2013-11-13 MED ORDER — CEFAZOLIN SODIUM-DEXTROSE 2-3 GM-% IV SOLR
2.0000 g | INTRAVENOUS | Status: AC
  Administered 2013-11-13: 2 g via INTRAVENOUS

## 2013-11-13 MED ORDER — LANOLIN HYDROUS EX OINT: 1.0000 "application " | TOPICAL_OINTMENT | CUTANEOUS | Status: DC | PRN

## 2013-11-13 MED ORDER — MAGNESIUM HYDROXIDE 400 MG/5ML PO SUSP: 30.0000 mL | ORAL | Status: DC | PRN

## 2013-11-13 SURGICAL SUPPLY — 38 items
BENZOIN TINCTURE PRP APPL 2/3 (GAUZE/BANDAGES/DRESSINGS) ×3 IMPLANT
CLAMP CORD UMBIL (MISCELLANEOUS) IMPLANT
CLOSURE WOUND 1/2 X4 (GAUZE/BANDAGES/DRESSINGS) ×1
CLOTH BEACON ORANGE TIMEOUT ST (SAFETY) ×3 IMPLANT
CONTAINER PREFILL 10% NBF 15ML (MISCELLANEOUS) IMPLANT
DRAPE LG THREE QUARTER DISP (DRAPES) IMPLANT
DRSG OPSITE POSTOP 4X10 (GAUZE/BANDAGES/DRESSINGS) ×3 IMPLANT
DURAPREP 26ML APPLICATOR (WOUND CARE) ×3 IMPLANT
ELECT REM PT RETURN 9FT ADLT (ELECTROSURGICAL) ×3
ELECTRODE REM PT RTRN 9FT ADLT (ELECTROSURGICAL) ×1 IMPLANT
EXTRACTOR VACUUM KIWI (MISCELLANEOUS) IMPLANT
EXTRACTOR VACUUM M CUP 4 TUBE (SUCTIONS) IMPLANT
EXTRACTOR VACUUM M CUP 4' TUBE (SUCTIONS)
GLOVE BIO SURGEON STRL SZ7.5 (GLOVE) ×3 IMPLANT
GLOVE BIO SURGEON STRL SZ8 (GLOVE) ×3 IMPLANT
GLOVE ORTHO TXT STRL SZ7.5 (GLOVE) ×3 IMPLANT
GOWN STRL REUS W/TWL LRG LVL3 (GOWN DISPOSABLE) ×9 IMPLANT
KIT ABG SYR 3ML LUER SLIP (SYRINGE) IMPLANT
NEEDLE HYPO 25X5/8 SAFETYGLIDE (NEEDLE) IMPLANT
NS IRRIG 1000ML POUR BTL (IV SOLUTION) ×3 IMPLANT
PACK C SECTION WH (CUSTOM PROCEDURE TRAY) ×3 IMPLANT
PAD OB MATERNITY 4.3X12.25 (PERSONAL CARE ITEMS) ×3 IMPLANT
RTRCTR C-SECT PINK 25CM LRG (MISCELLANEOUS) ×3 IMPLANT
STAPLER VISISTAT 35W (STAPLE) IMPLANT
STRIP CLOSURE SKIN 1/2X4 (GAUZE/BANDAGES/DRESSINGS) ×2 IMPLANT
SUT CHROMIC 1 CTX 36 (SUTURE) ×6 IMPLANT
SUT PLAIN 0 NONE (SUTURE) IMPLANT
SUT PLAIN 2 0 XLH (SUTURE) ×3 IMPLANT
SUT VIC AB 0 CT1 27 (SUTURE) ×4
SUT VIC AB 0 CT1 27XBRD ANBCTR (SUTURE) ×2 IMPLANT
SUT VIC AB 2-0 CT1 27 (SUTURE) ×2
SUT VIC AB 2-0 CT1 TAPERPNT 27 (SUTURE) ×1 IMPLANT
SUT VIC AB 3-0 SH 27 (SUTURE) ×2
SUT VIC AB 3-0 SH 27X BRD (SUTURE) ×1 IMPLANT
SUT VIC AB 4-0 KS 27 (SUTURE) ×3 IMPLANT
TOWEL OR 17X24 6PK STRL BLUE (TOWEL DISPOSABLE) ×6 IMPLANT
TRAY FOLEY CATH 14FR (SET/KITS/TRAYS/PACK) ×3 IMPLANT
WATER STERILE IRR 1000ML POUR (IV SOLUTION) IMPLANT

## 2013-11-13 NOTE — Op Note (Signed)
Preoperative diagnosis: Intrauterine pregnancy at 39 weeks, previous c-section x 3 Postoperative diagnosis: Same Procedure: Repeat low transverse cesarean section without extensions Surgeon: Lavina Hammanodd Saori Umholtz M.D. Assistant:  Tracey Harrieshomas Henley, MD Anesthesia: Spinal Findings: Patient had normal gravid anatomy and delivered a viable female infant with Apgars of 9 and 9 weight pending Estimated blood loss: 1000 cc Specimens: Placenta sent to labor and delivery Complications: None  Procedure in detail: The patient was taken to the operating room and placed in the sitting position. Dr. Cristela BlueKyle Jackson instilled spinal anesthesia.  She was then placed in the dorsosupine position with left tilt. Abdomen was then prepped and draped in the usual sterile fashion, and a foley catheter was inserted. The level of her anesthesia was found to be adequate. Abdomen was entered via a standard Pfannenstiel incision through her previous scar. Once the peritoneal cavity was entered the Alexis disposable self-retaining retractor was placed and good visualization was achieved. There were some adhesions of the bladder to the lower uterine segment, these were released sharply.  A 4 cm transverse incision was then made in the lower uterine segment pushing the bladder inferior. Once the uterine cavity was entered the incision was extended digitally. The fetal vertex was grasped and delivered through the incision atraumatically. Mouth and nares were suctioned, fluid was light meconium. The remainder of the infant then delivered atraumatically. Cord was doubly clamped and cut and the infant handed to the awaiting pediatric team. Cord blood was obtained. The placenta delivered spontaneously. Uterus was wiped dry with clean lap pad and all clots and debris were removed. Uterine incision was inspected and found to be free of extensions. Uterine incision was closed in 1 layer with running locking #1 Chromic. Tubes and ovaries were inspected and  found to be normal. Uterine incision was inspected and found to be hemostatic. Bleeding from serosal edges was controlled with electrocautery. The Alexis retractor was removed. Subfascial space was irrigated and made hemostatic with electrocautery. Peritoneum was closed with running 2-0 Vicryl, a bleeder under the superior fascia was controlled with 3-0 Vicryl.  Fascia was closed in running fashion starting at both ends and meeting in the middle with 0 Vicryl. Subcutaneous tissue was then irrigated and made hemostatic with electrocautery, then closed with running 2-0 plain gut. Skin was closed with running subcuticular 4-0 Vicryl, followed by steri-strips and a sterile dressing. Patient tolerated the procedure well and was taken to the recovery in stable condition. Counts were correct x2, she received Ancef 2 g IV at the beginning of the procedure and she had PAS hose on throughout the procedure.

## 2013-11-13 NOTE — Anesthesia Procedure Notes (Signed)

## 2013-11-13 NOTE — Anesthesia Postprocedure Evaluation (Signed)
  Anesthesia Post-op Note  Patient: Franchot ErichsenKimberly D Pietrzak  Procedure(s) Performed: Procedure(s) with comments: CESAREAN SECTION (N/A) - 1 1/2hrs OR time  Patient Location: PACU  Anesthesia Type:Spinal  Level of Consciousness: awake, alert  and oriented  Airway and Oxygen Therapy: Patient Spontanous Breathing  Post-op Pain: none  Post-op Assessment: Post-op Vital signs reviewed, Patient's Cardiovascular Status Stable, Respiratory Function Stable, Patent Airway, No signs of Nausea or vomiting, Pain level controlled, No headache and No backache  Post-op Vital Signs: Reviewed and stable  Complications: No apparent anesthesia complications

## 2013-11-13 NOTE — Progress Notes (Signed)
Called Dr. Rodman Pickleassidy regarding patient who is c/o incisional pain 6/10. Had motrin at 1948. History of substance abuse, MD did not order percocet. Dr. Rodman Pickleassidy to put in order for po tylenol for pain. Will continue to monitor patient.

## 2013-11-13 NOTE — Interval H&P Note (Signed)
History and Physical Interval Note:  11/13/2013 10:39 AM  Dana [name]  has presented today for surgery, with the diagnosis of Repeat C/Section, [id number]  The various methods of treatment have been discussed with the patient and family. After consideration of risks, benefits and other options for treatment, the patient has consented to  Procedure(s) with comments: CESAREAN SECTION (N/A) - 1 1/2hrs OR time as a surgical intervention .  The patient's history has been reviewed, patient examined, no change in status, stable for surgery.  I have reviewed the patient's chart and labs.  Questions were answered to the patient's satisfaction.     Liz Claiborneodd Roderick Sweezy

## 2013-11-13 NOTE — Anesthesia Preprocedure Evaluation (Addendum)
Anesthesia Evaluation  Patient identified by MRN, date of birth, ID band Patient awake    Reviewed: Allergy & Precautions, H&P , Patient's Chart, lab work & pertinent test results  Airway Mallampati: II TM Distance: >3 FB Neck ROM: full    Dental no notable dental hx.    Pulmonary former smoker,  breath sounds clear to auscultation  Pulmonary exam normal       Cardiovascular Exercise Tolerance: Good hypertension, Rhythm:regular Rate:Normal     Neuro/Psych    GI/Hepatic GERD-  Medicated,  Endo/Other  diabetes, Gestational  Renal/GU      Musculoskeletal   Abdominal   Peds  Hematology  (+) anemia ,   Anesthesia Other Findings   Reproductive/Obstetrics                          Anesthesia Physical Anesthesia Plan  ASA: II  Anesthesia Plan: Spinal   Post-op Pain Management:    Induction:   Airway Management Planned:   Additional Equipment:   Intra-op Plan:   Post-operative Plan:   Informed Consent: I have reviewed the patients History and Physical, chart, labs and discussed the procedure including the risks, benefits and alternatives for the proposed anesthesia with the patient or authorized representative who has indicated his/her understanding and acceptance.   Dental Advisory Given  Plan Discussed with: CRNA  Anesthesia Plan Comments: (Lab work confirmed with CRNA in room. Platelets okay. Discussed spinal anesthetic, and patient consents to the procedure:  included risk of possible headache,backache, failed block, allergic reaction, and nerve injury. This patient was asked if she had any questions or concerns before the procedure started. )        Anesthesia Quick Evaluation

## 2013-11-13 NOTE — Addendum Note (Signed)
Addendum created 11/13/13 2147 by Dana AllanAmy Shakeema Lippman, MD   Modules edited: Orders

## 2013-11-13 NOTE — Progress Notes (Signed)
Patient requests MD to be called regarding pain medication for her hospital stay. States she can take a narcotic pain medication while here in the hospital but [facility]. Asked patient if she had used the nasal spray before and she says no. Tried to explain to her that it should work well for her pain and she still requests MD to be called. Called Dr. Ellyn HackBovard regarding patient. Per Dr. Ellyn HackBovard, continue with pain management plan set up by Dr. Jackelyn KnifeMeisinger. Will inform and continue to monitor patient.

## 2013-11-13 NOTE — Consult Note (Signed)
[facility]  Delivery Note:  C-section       11/13/2013  11:47 AM  I was called to the operating room at the request of the patient's obstetrician (Dr. Jackelyn KnifeMeisinger) for a repeat c-section.    PRENATAL HX:  38 y.o. G8 P2143 at [redacted] weeks gestation, history of 3 previous c-sections.  Her pregnancy has been complicated by GDM for which she is taking Lantus and pm NPH insulin.  She has a history of substance abuse and [facility], [facility] not [facility].  The infant has had reactive NSTs.  O+, GBS+.  All other labs negative.  INTRAPARTUM HX:  Elective repeat c-section with AROM at delivery so GBS prophylaxis not indicated.   DELIVERY:   Infant was vigorous at delivery, requiring no resuscitation other than standard warming, drying and stimulation.  APGARs 8 and 8, cyanosis resolved by 7 minutes of age without supplemental oxygen.  Exam within normal limits.  Admit to central nursery for routine newborn care.  After 5 minutes, baby left with nurse to assist parents with skin-to-skin care. _____________________ Electronically Signed By: Maryan CharLindsey Jakyrah Holladay, MD

## 2013-11-13 NOTE — Transfer of Care (Signed)
Immediate Anesthesia Transfer of Care Note  Patient: Dana [name]  Procedure(s) Performed: Procedure(s) with comments: CESAREAN SECTION (N/A) - 1 1/2hrs OR time  Patient Location: PACU  Anesthesia Type:Spinal  Level of Consciousness: awake, alert  and oriented  Airway & Oxygen Therapy: Patient Spontanous Breathing  Post-op Assessment: Report given to PACU RN and Post -op Vital signs reviewed and stable  Post vital signs: Reviewed and stable  Complications: No apparent anesthesia complications

## 2013-11-14 ENCOUNTER — Encounter (HOSPITAL_COMMUNITY): Payer: Self-pay | Admitting: Obstetrics and Gynecology

## 2013-11-14 LAB — GLUCOSE, CAPILLARY
GLUCOSE-CAPILLARY: 193 mg/dL — AB (ref 70–99)
Glucose-Capillary: 103 mg/dL — ABNORMAL HIGH (ref 70–99)
Glucose-Capillary: 156 mg/dL — ABNORMAL HIGH (ref 70–99)

## 2013-11-14 LAB — CBC
HEMATOCRIT: 24.1 % — AB (ref 36.0–46.0)
Hemoglobin: 7.6 g/dL — ABNORMAL LOW (ref 12.0–15.0)
MCH: 26.9 pg (ref 26.0–34.0)
MCHC: 31.5 g/dL (ref 30.0–36.0)
MCV: 85.2 fL (ref 78.0–100.0)
PLATELETS: 215 10*3/uL (ref 150–400)
RBC: 2.83 MIL/uL — AB (ref 3.87–5.11)
RDW: 14.7 % (ref 11.5–15.5)
WBC: 12.2 10*3/uL — AB (ref 4.0–10.5)

## 2013-11-14 LAB — BIRTH TISSUE RECOVERY COLLECTION (PLACENTA DONATION)

## 2013-11-14 MED ORDER — OXYCODONE-ACETAMINOPHEN 5-325 MG PO TABS
1.0000 | ORAL_TABLET | ORAL | Status: DC | PRN
  Administered 2013-11-14: 2 via ORAL
  Administered 2013-11-14: 1 via ORAL
  Administered 2013-11-14 (×2): 2 via ORAL
  Administered 2013-11-15: 1 via ORAL
  Filled 2013-11-14 (×2): qty 2
  Filled 2013-11-14 (×2): qty 1
  Filled 2013-11-14: qty 2

## 2013-11-14 NOTE — Progress Notes (Signed)
Subjective: Postpartum Day #1: Cesarean Delivery Patient reports incisional pain, tolerating PO and no problems voiding.  She does not think Stadol NS works well enough for pain for now, wants to try Percocet today.  Objective: Vital signs in last 24 hours: Temp:  [97.5 F (36.4 C)-98.7 F (37.1 C)] 98.3 F (36.8 C) (05/05 0420) Pulse Rate:  [76-121] 88 (05/05 0420) Resp:  [18-22] 20 (05/05 0420) BP: (106-132)/(58-72) 113/72 mmHg (05/05 0420) SpO2:  [96 %-100 %] 96 % (05/05 0420) Weight:  [71.668 kg (158 lb)] 71.668 kg (158 lb) (05/04 1300)  Physical Exam:  General: alert Lochia: appropriate Uterine Fundus: firm Incision: dressing C/D/I   Recent Labs  11/14/13 0543  HGB 7.6*  HCT 24.1*  Pre-op Hgb 9.2  Assessment/Plan: Status post Cesarean section. Doing well postoperatively.  Continue current care, ambulate, will do Percocet for pain today.  Tanga Gloor 11/14/2013, 8:22 AM

## 2013-11-14 NOTE — Clinical Social Work Maternal (Addendum)
  Clinical Social Work Department PSYCHOSOCIAL ASSESSMENT - MATERNAL/CHILD 11/14/2013  Patient:  [name],[name]  Account Number:  [id number]  Admit Date:  11/13/2013  Childs Name:   Dana [name]    Clinical Social Worker:  TEDRA SLADE, LCSW   Date/Time:  11/14/2013 02:47 PM  Date Referred:  11/14/2013   Referral source  CN     Referred reason  Depression/Anxiety  Substance Abuse   Other referral source:    I:  FAMILY / HOME ENVIRONMENT Child's legal guardian:  PARENT  Guardian - Name Guardian - Age Guardian - Address  Tameyah Bo [address] [place], [place] [address]   Other household support members/support persons Other support:   Staff at [facility]    II  PSYCHOSOCIAL DATA Information Source:  Patient Interview  Financial and Community Resources Employment:   Financial resources:  Medicaid If Medicaid - County:  [place] Other  Food Stamps   School / Grade:   Maternity Care Coordinator / Child Services Coordination / Early Interventions:  Cultural issues impacting care:    III  STRENGTHS Strengths  Adequate Resources  Home prepared for Child (including basic supplies)  Supportive family/friends   Strength comment:    IV  RISK FACTORS AND CURRENT PROBLEMS Current Problem:  YES   Risk Factor & Current Problem Patient Issue Family Issue Risk Factor / Current Problem Comment  Mental Illness Y N Hx of depression  Substance Abuse Y N Hx of Etoh use  DSS Involvement Y N Previous DSS involvement    V  SOCIAL WORK ASSESSMENT CSW met with pt to assess her current social situation & offer resources as needed .  Pt was accompanied by a female, who she identified as one of her primary support people.  Pt gave CSW verbal permission to speak in her support persons presence.  Pt is currently living at [facility], a home for women with substance abuse issues who are pregnant or have children.  Pt identified her substance of choice at [facility].  As a  results of her substance use, she loss custody of her children to her ex husband.  She recently relinquished her rights in Dec. '14 for her youngest child.   The pt moved into [facility] in Aug. '14.  She is looking for to obtaining one year of sobriety 11/26/13.  She denies other illegal substance use & verbalized an understanding of hospital drug testing policy.  UDS is negative, meconium results are pending.  Pt attends daily AA/NAA meetings required of residence of the house.  She also participates in weekly parenting classes & "relapse prevention" instruction bi-weekly.  She attends therapy sessions once a month at [facility].  Her depression symptoms have been treated consistently with an anti-depressant for the past 8 years.  Currently, her OBGYN is prescribing the Celexa, which she says works well.  She has all the necessary supplies for the infant.  The pt plans to remain at [facility] until she can gain employment & transition into [facility].  Pt denies any current involvement with Child Protective Services however due to history, a report was made.  Staff at [facility] will provide transportation home.  CSW encouraged pt to continue with treatment & praised her for her success thus far.  CSW will continue to monitor drug screen results & make a referral if needed.      VI SOCIAL WORK PLAN Social Work Plan  No Further Intervention Required / No Barriers to Discharge     Type of pt/family education:   If child protective services report - county:  [place] If child protective services report - date:  11/14/13 Information/referral to community resources comment:   Other social work plan:

## 2013-11-14 NOTE — Lactation Note (Signed)
This note was copied from the chart of Dana Evlyn ClinesKimberly Lawley. Lactation Consultation Note: Initial visit with mom. Baby now 3723 hours old. Baby latched to breast when I went into room. Mom reports that her nipples are very sore- the baby nursed a lot through the night. Both nipples pink. Reviewed wide open mouth and keeping the baby close to the breast throughout the feeding. Reviewed football hold and using pillows for good support. Comfort gels given with instructions for use and cleaning. No questions at present. BF brochure given with resources for support after DC. To call for assist prn  Patient Name: Dana [name] UJWJX'BToday's Date: 11/14/2013 Reason for consult: Initial assessment   Maternal Data Formula Feeding for Exclusion: No Infant to breast within first hour of birth: Yes Does the patient have breastfeeding experience prior to this delivery?: Yes  Feeding Feeding Type: Breast Fed  LATCH Score/Interventions Latch: Grasps breast easily, tongue down, lips flanged, rhythmical sucking.  Audible Swallowing: A few with stimulation  Type of Nipple: Everted at rest and after stimulation  Comfort (Breast/Nipple): Filling, red/small blisters or bruises, mild/mod discomfort  Problem noted: Mild/Moderate discomfort Interventions (Mild/moderate discomfort): Comfort gels;Hand expression  Hold (Positioning): Assistance needed to correctly position infant at breast and maintain latch. Intervention(s): Breastfeeding basics reviewed;Support Pillows;Position options  LATCH Score: 7  Lactation Tools Discussed/Used Tools: Comfort gels   Consult Status Consult Status: Follow-up Date: 11/15/13 Follow-up type: In-patient    [name] 11/14/2013, 11:23 AM

## 2013-11-14 NOTE — Addendum Note (Signed)
Addendum created 11/14/13 0944 by Elbert Ewingsolleen S Quyen Cutsforth, CRNA   Modules edited: Notes Section   Notes Section:  File: [id number]

## 2013-11-14 NOTE — Progress Notes (Signed)
UR chart review completed.  

## 2013-11-14 NOTE — Anesthesia Postprocedure Evaluation (Signed)
  Anesthesia Post-op Note  Patient: Dana [name]  Procedure(s) Performed: Procedure(s) with comments: CESAREAN SECTION (N/A) - 1 1/2hrs OR time  Patient Location: PACU and Mother/Baby  Anesthesia Type:Spinal  Level of Consciousness: awake, alert  and oriented  Airway and Oxygen Therapy: Patient Spontanous Breathing  Post-op Pain: mild  Post-op Assessment: Patient's Cardiovascular Status Stable, Respiratory Function Stable, No signs of Nausea or vomiting, Adequate PO intake and Pain level controlled  Post-op Vital Signs: Reviewed and stable  Last Vitals:  Filed Vitals:   11/14/13 0905  BP: 113/64  Pulse:   Temp: 36.7 C  Resp: 20    Complications: No apparent anesthesia complications

## 2013-11-14 NOTE — Progress Notes (Signed)
Patient and support person disgruntled about pain management plan for hospital and wanting this RN to call Dr. Jackelyn KnifeMeisinger. Informed both that patient may address pain management with Dr. Jackelyn KnifeMeisinger in the AM but that this RN cannot call him tonight but that I have spoken with the on call doctor. Again encouraged her to try the stadol nasal spray for pain management. Patient agreeable to try this but declines tylenol ordered by anesthesia. Will continue to monitor patient.

## 2013-11-15 MED ORDER — BUTORPHANOL TARTRATE 10 MG/ML NA SOLN
1.0000 | NASAL | Status: DC | PRN
  Administered 2013-11-15 – 2013-11-16 (×6): 1 via NASAL

## 2013-11-15 NOTE — Progress Notes (Signed)
POD #2 Doing well, some pain but tolerable Afeb, VSS, all CBG were nl Abd- soft, fundus firm, dressing intact Continue routine care, will change back to Stadol NS today so she knows what to expect when she goes home

## 2013-11-15 NOTE — Lactation Note (Signed)
This note was copied from the chart of Dana [name] Tukes. Lactation Consultation Note  Follow up visit at 58 hours of age.  Mom denies problems at this time.  She does report cracked nipples, but has comfort gels and used EBM.  Mom holding baby swaddled in blanket and says she just fed baby.  Mom to call for assist as needed.   Patient Name: Dana [name] Mcfayden ZOXWR'UToday's Date: 11/15/2013 Reason for consult: Follow-up assessment   Maternal Data    Feeding    LATCH Score/Interventions                Intervention(s): Breastfeeding basics reviewed     Lactation Tools Discussed/Used     Consult Status Consult Status: Follow-up Date: 11/16/13 Follow-up type: In-patient    Arvella MerlesJana Lynn Shoptaw 11/15/2013, 10:37 PM

## 2013-11-16 MED ORDER — TRAMADOL HCL 50 MG PO TABS
50.0000 mg | ORAL_TABLET | Freq: Four times a day (QID) | ORAL | Status: DC | PRN
  Administered 2013-11-16 (×2): 50 mg via ORAL
  Filled 2013-11-16 (×2): qty 1

## 2013-11-16 MED ORDER — BUTORPHANOL TARTRATE 10 MG/ML NA SOLN
1.0000 | NASAL | Status: DC | PRN
  Administered 2013-11-16 (×3): 1 via NASAL

## 2013-11-16 MED ORDER — TRAMADOL HCL 50 MG PO TABS: 50.0000 mg | ORAL_TABLET | Freq: Four times a day (QID) | ORAL | Status: DC | PRN

## 2013-11-16 MED ORDER — BUTORPHANOL TARTRATE 10 MG/ML NA SOLN: 1.0000 | NASAL | Status: DC | PRN

## 2013-11-16 NOTE — Lactation Note (Signed)
This note was copied from the chart of Dana Evlyn ClinesKimberly Mulnix. Lactation Consultation Note  Patient Name: Dana [name] ZOXWR'UToday's Date: 11/16/2013 Reason for consult: Follow-up assessment Per mom breast feeding is going well this am , my milk is in a breast have hard areas.  LC assessed breast tissue - nipples pinky red, and cracked , per mom has been using  breast milk and comfort gels. LC added breast shells with instructions. Per mom already has a hand pump, Lc offered to check flange and mom declined. LC reviewed sore nipple and engorgement prevention and tx . Already has comfort gels. Mother informed of post-discharge support and given phone number to the lactation department, including services for phone call assistance; out-patient appointments; and breastfeeding support group. List of other breastfeeding resources in the community given in the handout. Encouraged mother to call for problems or concerns related to breastfeeding.  Maternal Data Has patient been taught Hand Expression?: Yes  Feeding Feeding Type: Breast Fed Length of feed: 45 min (per mom )  LATCH Score/Interventions Latch: Grasps breast easily, tongue down, lips flanged, rhythmical sucking. Intervention(s): Adjust position;Assist with latch;Breast massage;Breast compression  Audible Swallowing: Spontaneous and intermittent  Type of Nipple: Everted at rest and after stimulation  Comfort (Breast/Nipple): Filling, red/small blisters or bruises, mild/mod discomfort (border line engorgement )  Problem noted: Filling;Mild/Moderate discomfort;Cracked, bleeding, blisters, bruises Interventions (Mild/moderate discomfort): Comfort gels  Hold (Positioning): Assistance needed to correctly position infant at breast and maintain latch. Intervention(s): Breastfeeding basics reviewed;Support Pillows;Position options;Skin to skin  LATCH Score: 8  Lactation Tools Discussed/Used Tools: Shells;Comfort gels;Pump Shell  Type: Inverted Breast pump type: Manual WIC Program: No Pump Review: Setup, frequency, and cleaning;Milk Storage Initiated by:: MAi  Date initiated:: 11/16/13   Consult Status Consult Status: Complete Date: 11/16/13 Follow-up type: In-patient    Matilde SprangMargaret Ann Delyle Weider 11/16/2013, 10:29 AM

## 2013-11-16 NOTE — Progress Notes (Signed)
POD #3 Doing ok, Stadol 4 4 hrs and Ibuprofen not working well enough for uterine cramping.  Apparently there is a meeting at noon today to see if she will be able to take baby home with her. Afeb, VSS Abd- soft, fundus firm, incision intact Will increase Stadol NS to q 3 hrs, add Ultram prn.  I will check back this pm to see if she can go home.

## 2013-11-16 NOTE — Discharge Summary (Signed)
Obstetric Discharge Summary Reason for Admission: cesarean section Prenatal Procedures:  GDM requiring insulin Intrapartum Procedures: cesarean: low cervical, transverse Postpartum Procedures: none Complications-Operative and Postpartum: none Hemoglobin  Date Value Ref Range Status  11/14/2013 7.6* 12.0 - 15.0 g/dL Final     HCT  Date Value Ref Range Status  11/14/2013 24.1* 36.0 - 46.0 % Final    Physical Exam:  General: alert Lochia: appropriate Uterine Fundus: firm Incision: healing well  Discharge Diagnoses: Term Pregnancy-delivered  Discharge Information: Date: 11/16/2013 Activity: pelvic rest and no strenuous activity Diet: routine Medications: Stadol NS, Tramadol Condition: stable Instructions: refer to practice specific booklet Discharge to: home Follow-up Information   Follow up with Catina Nuss D, MD. Schedule an appointment as soon as possible for a visit in 2 weeks.   Specialty:  Obstetrics and Gynecology   Contact information:   [address], [address] [place] [address] [phone]       Newborn Data: Live born female  Birth Weight: 6 lb 15.8 oz (3170 g) APGAR: 8, 9  Home with mother.  Cate Oravec 11/16/2013, 1:31 PM

## 2013-11-16 NOTE — Discharge Instructions (Signed)
As per discharge pamphlet °

## 2013-11-21 ENCOUNTER — Encounter (HOSPITAL_COMMUNITY): Payer: Self-pay | Admitting: *Deleted

## 2014-04-04 ENCOUNTER — Emergency Department (HOSPITAL_COMMUNITY)
Admission: EM | Admit: 2014-04-04 | Discharge: 2014-04-04 | Disposition: A | Discharge status: Home / Self Care | Payer: Medicaid Other | Attending: Emergency Medicine | Admitting: Emergency Medicine

## 2014-04-04 ENCOUNTER — Encounter (HOSPITAL_COMMUNITY): Payer: Self-pay | Admitting: Emergency Medicine

## 2014-04-04 DIAGNOSIS — Z8679 Personal history of other diseases of the circulatory system: Secondary | ICD-10-CM | POA: Diagnosis not present

## 2014-04-04 DIAGNOSIS — Z87440 Personal history of urinary (tract) infections: Secondary | ICD-10-CM | POA: Diagnosis not present

## 2014-04-04 DIAGNOSIS — K219 Gastro-esophageal reflux disease without esophagitis: Secondary | ICD-10-CM | POA: Diagnosis not present

## 2014-04-04 DIAGNOSIS — F329 Major depressive disorder, single episode, unspecified: Secondary | ICD-10-CM | POA: Diagnosis not present

## 2014-04-04 DIAGNOSIS — R05 Cough: Secondary | ICD-10-CM

## 2014-04-04 DIAGNOSIS — Z79899 Other long term (current) drug therapy: Secondary | ICD-10-CM | POA: Insufficient documentation

## 2014-04-04 DIAGNOSIS — Z87891 Personal history of nicotine dependence: Secondary | ICD-10-CM | POA: Insufficient documentation

## 2014-04-04 DIAGNOSIS — F3289 Other specified depressive episodes: Secondary | ICD-10-CM | POA: Insufficient documentation

## 2014-04-04 DIAGNOSIS — R059 Cough, unspecified: Secondary | ICD-10-CM

## 2014-04-04 DIAGNOSIS — Z8632 Personal history of gestational diabetes: Secondary | ICD-10-CM | POA: Insufficient documentation

## 2014-04-04 DIAGNOSIS — R0981 Nasal congestion: Secondary | ICD-10-CM

## 2014-04-04 DIAGNOSIS — J029 Acute pharyngitis, unspecified: Secondary | ICD-10-CM | POA: Insufficient documentation

## 2014-04-04 LAB — RAPID STREP SCREEN (MED CTR MEBANE ONLY): Streptococcus, Group A Screen (Direct): NEGATIVE

## 2014-04-04 MED ORDER — PHENOL 1.4 % MT LIQD: 2.0000 | OROMUCOSAL | Status: DC | PRN

## 2014-04-04 NOTE — ED Provider Notes (Signed)
CSN: [id number]     Arrival date & time 04/04/14  0902 History  This chart was scribed for non-physician practitioner, Sharilyn Sites, PA-C working with Samuel Jester, DO by Greggory Stallion, ED scribe. This patient was seen in room TR11C/TR11C and the patient's care was started at 10:05 AM.   Chief Complaint  Patient presents with  . Sore Throat  . Fever   The history is provided by the patient. No language interpreter was used.   HPI Comments: Dana [name] is a 38 y.o. female who presents to the Emergency Department complaining of sore throat, nasal congestion and nonproductive cough that started one week ago. Reports fever of 101.6 at home 4 days ago but states none since that time.  Denies chills, sweats. States multiple sick contacts at home with similar symptoms.  Daughter was just seen in [facility] ED and told she likely had viral URI.  No chest pain, SOB, palpitations, dizziness, weakness.  VS stable on arrival.  No intervention tried PTA.  Past Medical History  Diagnosis Date  . High-risk pregnancy   . Preeclampsia     History only with first preg 2006  . GERD (gastroesophageal reflux disease)     ocasional with pregnancy only  . Pregnancy induced hypertension     first preg  . Gestational diabetes     History with previous pregnancy 2009  . Preterm labor     1st delivery  . Infection     UTI  . Depression     on meds- doing well   Past Surgical History  Procedure Laterality Date  . Cesarean section    . Wisdom tooth extraction    . Cesarean section  05/16/2012    Procedure: CESAREAN SECTION;  Surgeon: Lavina Hamman, MD;  Location: [facility];  Service: Obstetrics;  Laterality: N/A;  . Dilation and curettage of uterus    . Therapeutic abortion    . Cesarean section N/A 11/13/2013    Procedure: CESAREAN SECTION;  Surgeon: Lavina Hamman, MD;  Location: [facility];  Service: Obstetrics;  Laterality: N/A;  1 1/2hrs OR time   Family History  Problem Relation Age of Onset  .  Heart disease Father   . Heart disease Paternal Grandfather    History  Substance Use Topics  . Smoking status: Former Smoker -- 0.50 packs/day for 15 years    Types: Cigarettes    Quit date: 03/13/2013  . Smokeless tobacco: Never Used  . Alcohol Use: No   OB History   Grav Para Term Preterm Abortions TAB SAB Ect Mult Living   Review of Systems  Constitutional: Positive for fever.  HENT: Positive for congestion and sore throat.   Respiratory: Positive for cough.   All other systems reviewed and are negative.  Allergies  Flagyl  Home Medications   Prior to Admission medications   Medication Sig Start Date End Date Taking? Authorizing Provider  acetaminophen (TYLENOL) 500 MG tablet Take 1,000 mg by mouth 2 (two) times daily as needed (For pain.).     Historical Provider, MD  butorphanol (STADOL) 10 MG/ML nasal spray Place 1 spray into the nose every 3 (three) hours as needed (moderate to severe pain). 11/16/13   Lavina Hamman, MD  citalopram (CELEXA) 40 MG tablet Take 40 mg by mouth daily.    Historical Provider, MD  diphenhydrAMINE (BENADRYL) 25 mg capsule Take 25 mg by mouth at bedtime as  needed for itching.     Historical Provider, MD  Prenatal Vit-Fe Fumarate-FA (PRENATAL MULTIVITAMIN) TABS tablet Take 1 tablet by mouth at bedtime.    Historical Provider, MD  ranitidine (ZANTAC) 150 MG capsule Take 150 mg by mouth every evening.    Historical Provider, MD  traMADol (ULTRAM) 50 MG tablet Take 1 tablet (50 mg total) by mouth every 6 (six) hours as needed for severe pain. 11/16/13   Lavina Hamman, MD   BP 135/63  Pulse 84  Temp(Src) 98.7 F (37.1 C) (Oral)  Resp 18  SpO2 100%  Physical Exam  Nursing note and vitals reviewed. Constitutional: She is oriented to person, place, and time. She appears well-developed and well-nourished. No distress.  HENT:  Head: Normocephalic and atraumatic.  Right Ear: Tympanic membrane and ear canal normal.  Left Ear:  Tympanic membrane and ear canal normal.  Nose: Mucosal edema present.  Mouth/Throat: Uvula is midline, oropharynx is clear and moist and mucous membranes are normal. No oropharyngeal exudate, posterior oropharyngeal edema, posterior oropharyngeal erythema or tonsillar abscesses.  PND noted; Tonsils normal in appearance bilaterally without exudate; uvula midline without peritonsillar abscess; handling secretions appropriately; no difficulty swallowing or speaking  Eyes: Conjunctivae and EOM are normal. Pupils are equal, round, and reactive to light.  Neck: Normal range of motion. Neck supple.  Cardiovascular: Normal rate, regular rhythm and normal heart sounds.   Pulmonary/Chest: Effort normal and breath sounds normal. No accessory muscle usage. Not tachypneic. No respiratory distress. She has no wheezes. She has no rhonchi.  Musculoskeletal: Normal range of motion.  Lymphadenopathy:    She has no cervical adenopathy.  Neurological: She is alert and oriented to person, place, and time.  Skin: Skin is warm and dry. She is not diaphoretic.  Psychiatric: She has a normal mood and affect.    ED Course  Procedures (including critical care time)  DIAGNOSTIC STUDIES: Oxygen Saturation is 100% on RA, normal by my interpretation.    COORDINATION OF CARE: 10:05 AM-Discussed treatment plan which includes supportive care with pt at bedside and pt agreed to plan.   Labs Review Labs Reviewed  RAPID STREP SCREEN  CULTURE, GROUP A STREP    Imaging Review No results found.   EKG Interpretation None      MDM   Final diagnoses:  Sore throat  Cough  Nasal congestion   Constellation of sx likely viral in nature.  Patient afebrile and non-toxic, airway patent and handling secretions well.  Rapid strep negative, culture pending.  Lungs clear without wheezes or rhonchi to suggest CAP, respirations unlabored, VS stable on RA.  Will d/c home with supportive care.  [facility].   Discussed plan with patient, he/she acknowledged understanding and agreed with plan of care.  Return precautions given for new or worsening symptoms.  I personally performed the services described in this documentation, which was scribed in my presence. The recorded information has been reviewed and is accurate.  Garlon Hatchet, PA-C 04/04/14 1108

## 2014-04-04 NOTE — ED Provider Notes (Signed)
Medical screening examination/treatment/procedure(s) were performed by non-physician practitioner and as supervising physician I was immediately available for consultation/collaboration.   EKG Interpretation None        Edithe Dobbin, DO 04/04/14 1817 

## 2014-04-04 NOTE — ED Notes (Signed)
Pt reports having sore throat and fever x 1 week. Airway intact.

## 2014-04-04 NOTE — Discharge Instructions (Signed)
Take the prescribed medication as directed. °Follow-up with [facility] if problems occur. °Return to the ED for new or worsening symptoms. ° °

## 2014-04-06 LAB — CULTURE, GROUP A STREP

## 2014-05-14 ENCOUNTER — Encounter (HOSPITAL_COMMUNITY): Payer: Self-pay | Admitting: Emergency Medicine

## 2014-05-24 ENCOUNTER — Emergency Department (HOSPITAL_COMMUNITY)
Admission: EM | Admit: 2014-05-24 | Discharge: 2014-05-24 | Disposition: A | Discharge status: Home / Self Care | Payer: Medicaid Other | Attending: Emergency Medicine | Admitting: Emergency Medicine

## 2014-05-24 ENCOUNTER — Encounter (HOSPITAL_COMMUNITY): Payer: Self-pay | Admitting: Emergency Medicine

## 2014-05-24 DIAGNOSIS — Z8632 Personal history of gestational diabetes: Secondary | ICD-10-CM | POA: Diagnosis not present

## 2014-05-24 DIAGNOSIS — K219 Gastro-esophageal reflux disease without esophagitis: Secondary | ICD-10-CM | POA: Diagnosis not present

## 2014-05-24 DIAGNOSIS — Z87440 Personal history of urinary (tract) infections: Secondary | ICD-10-CM | POA: Diagnosis not present

## 2014-05-24 DIAGNOSIS — Z79899 Other long term (current) drug therapy: Secondary | ICD-10-CM | POA: Diagnosis not present

## 2014-05-24 DIAGNOSIS — M79622 Pain in left upper arm: Secondary | ICD-10-CM | POA: Diagnosis not present

## 2014-05-24 DIAGNOSIS — Z8751 Personal history of pre-term labor: Secondary | ICD-10-CM | POA: Diagnosis not present

## 2014-05-24 DIAGNOSIS — R21 Rash and other nonspecific skin eruption: Secondary | ICD-10-CM

## 2014-05-24 DIAGNOSIS — Z87891 Personal history of nicotine dependence: Secondary | ICD-10-CM | POA: Insufficient documentation

## 2014-05-24 DIAGNOSIS — F329 Major depressive disorder, single episode, unspecified: Secondary | ICD-10-CM | POA: Diagnosis not present

## 2014-05-24 MED ORDER — ACYCLOVIR 400 MG PO TABS: 400.0000 mg | ORAL_TABLET | Freq: Four times a day (QID) | ORAL | Status: DC

## 2014-05-24 NOTE — ED Provider Notes (Signed)
CSN: [id number]     Arrival date & time 05/24/14  1125 History  This chart was scribed for non-physician practitioner working with Nelia Shiobert L Beaton, MD by Richarda Overlieichard Holland, ED Scribe. This patient was seen in room WTR6/WTR6 and the patient's care was started at 12:58 PM.    No chief complaint on file.  HPI HPI Comments: Dana [name] is a 38 y.o. female who presents to the Emergency Department complaining of left shoulder and upper arm pain for the last 10 days. She describes the pain as tender to touch, achy, dull and throbbing with slight associated itchiness. She states the pain radiates down to her left elbow. She denies that her pain is worse with movement. She denies any known injuries. She states ibuprofen has provided mild relief to her symptoms. She denies any numbness. She denies a history of clots.   Pt complains of a rash that started on her upper left arm this morning. Pt denies any new soaps, detergents or recent Abx. She denies any known insect bites. Pt reports she has previously had chicken pox. She denies any sick contacts. She reports she is UTD on all vaccines and this year's flu shot.  She denies any similar previous episodes. She deneis fever, chills, nausea and vomiting.    Past Medical History  Diagnosis Date  . High-risk pregnancy   . Preeclampsia     History only with first preg 2006  . GERD (gastroesophageal reflux disease)     ocasional with pregnancy only  . Pregnancy induced hypertension     first preg  . Gestational diabetes     History with previous pregnancy 2009  . Preterm labor     1st delivery  . Infection     UTI  . Depression     on meds- doing well   Past Surgical History  Procedure Laterality Date  . Cesarean section    . Wisdom tooth extraction    . Cesarean section  05/16/2012    Procedure: CESAREAN SECTION;  Surgeon: Lavina Hammanodd Meisinger, MD;  Location: [facility];  Service: Obstetrics;  Laterality: N/A;  . Dilation and curettage of uterus    .  Therapeutic abortion    . Cesarean section N/A 11/13/2013    Procedure: CESAREAN SECTION;  Surgeon: Lavina Hammanodd Meisinger, MD;  Location: [facility];  Service: Obstetrics;  Laterality: N/A;  1 1/2hrs OR time   Family History  Problem Relation Age of Onset  . Heart disease Father   . Heart disease Paternal Grandfather    History  Substance Use Topics  . Smoking status: Former Smoker -- 0.50 packs/day for 15 years    Types: Cigarettes    Quit date: 03/13/2013  . Smokeless tobacco: Never Used  . Alcohol Use: No   OB History    Gravida Para Term Preterm AB TAB SAB Ectopic Multiple Living   8 4 3 1 4 1 3   4      Review of Systems  Constitutional: Negative for fever and chills.  Gastrointestinal: Negative for nausea and vomiting.  Musculoskeletal: Positive for arthralgias.  Skin: Positive for color change and rash.  Neurological: Negative for numbness.      Allergies  Flagyl  Home Medications   Prior to Admission medications   Medication Sig Start Date End Date Taking? Authorizing Provider  acetaminophen (TYLENOL) 500 MG tablet Take 1,000 mg by mouth 2 (two) times daily as needed (For pain.).     Historical Provider, MD  butorphanol (STADOL) 10  MG/ML nasal spray Place 1 spray into the nose every 3 (three) hours as needed (moderate to severe pain). 11/16/13   Lavina Hammanodd Meisinger, MD  citalopram (CELEXA) 40 MG tablet Take 40 mg by mouth daily.    Historical Provider, MD  diphenhydrAMINE (BENADRYL) 25 mg capsule Take 25 mg by mouth at bedtime as needed for itching.     Historical Provider, MD  phenol (CHLORASEPTIC) 1.4 % LIQD Use as directed 2 sprays in the mouth or throat as needed for throat irritation / pain. 04/04/14   Garlon HatchetLisa M Sanders, PA-C  Prenatal Vit-Fe Fumarate-FA (PRENATAL MULTIVITAMIN) TABS tablet Take 1 tablet by mouth at bedtime.    Historical Provider, MD  ranitidine (ZANTAC) 150 MG capsule Take 150 mg by mouth every evening.    Historical Provider, MD  traMADol (ULTRAM) 50 MG tablet  Take 1 tablet (50 mg total) by mouth every 6 (six) hours as needed for severe pain. 11/16/13   Lavina Hammanodd Meisinger, MD   BP 123/65 mmHg  Pulse 98  Temp(Src) 97.7 F (36.5 C) (Oral)  Resp 16  SpO2 100% Physical Exam  Constitutional: She is oriented to person, place, and time. She appears well-developed and well-nourished.  HENT:  Head: Normocephalic and atraumatic.  Neck: Normal range of motion. Neck supple. No tracheal deviation present.  Cardiovascular: Normal rate.   Pulmonary/Chest: Effort normal. No respiratory distress.  Abdominal: She exhibits no distension.     Musculoskeletal:  Left arm mildly TTP. Left arm with full ROM. No point focal tenderness. Distal pulse intact. Normal grip strength.   Neurological: She is alert and oriented to person, place, and time.  Skin: Skin is warm and dry.  Left upper arm on medial aspect macular papular rash in a cluster distribution without any significant erythema. No vesicular lesions, without any petechia. Does not follow any specific dermatome  Psychiatric: She has a normal mood and affect. Her behavior is normal.  Nursing note and vitals reviewed.   ED Course  Procedures  DIAGNOSTIC STUDIES: Oxygen Saturation is 100% on RA, normal by my interpretation.    COORDINATION OF CARE: 1:08 PM Discussed treatment plan with pt at bedside and pt agreed to plan. Recommended applying Neosporin to the area. Pt expressed concern for possible shingle rash. Will prescribe medication for shingle rash but instructed patient to not use medication unless rash begins to blister. Pt instructed to take medication as prescribed if she does begin the shingle medication treatment. Dermatology referral as needed.    Low suspicion for bony pathology.  Pt is NVI.  Doubt DVT.  No joint involvement.    Labs Review Labs Reviewed - No data to display  Imaging Review No results found.   EKG Interpretation None      MDM   Final diagnoses:  Rash and nonspecific  skin eruption    BP 123/65 mmHg  Pulse 98  Temp(Src) 97.7 F (36.5 C) (Oral)  Resp 16  SpO2 100%  I personally performed the services described in this documentation, which was scribed in my presence. The recorded information has been reviewed and is accurate.     =  Fayrene HelperBowie Margarita Bobrowski, PA-C 05/24/14 1315  Nelia Shiobert L Beaton, MD 05/30/14 1255

## 2014-05-24 NOTE — Discharge Instructions (Signed)
If you notice blisters forming at the rash site, then you may start taking antiviral medication prescribed as treatment for shingle.  Follow up with dermatology if your condition worsen.  Take over the counter medication as needed for pain.   Rash A rash is a change in the color or texture of your skin. There are many different types of rashes. You may have other problems that accompany your rash. CAUSES   Infections.  Allergic reactions. This can include allergies to pets or foods.  Certain medicines.  Exposure to certain chemicals, soaps, or cosmetics.  Heat.  Exposure to poisonous plants.  Tumors, both cancerous and noncancerous. SYMPTOMS   Redness.  Scaly skin.  Itchy skin.  Dry or cracked skin.  Bumps.  Blisters.  Pain. DIAGNOSIS  Your caregiver may do a physical exam to determine what type of rash you have. A skin sample (biopsy) may be taken and examined under a microscope. TREATMENT  Treatment depends on the type of rash you have. Your caregiver may prescribe certain medicines. For serious conditions, you may need to see a skin doctor (dermatologist). HOME CARE INSTRUCTIONS   Avoid the substance that caused your rash.  Do not scratch your rash. This can cause infection.  You may take cool baths to help stop itching.  Only take over-the-counter or prescription medicines as directed by your caregiver.  Keep all follow-up appointments as directed by your caregiver. SEEK IMMEDIATE MEDICAL CARE IF:  You have increasing pain, swelling, or redness.  You have a fever.  You have new or severe symptoms.  You have body aches, diarrhea, or vomiting.  Your rash is not better after 3 days. MAKE SURE YOU:  Understand these instructions.  Will watch your condition.  Will get help right away if you are not doing well or get worse. Document Released: 06/19/2002 Document Revised: 09/21/2011 Document Reviewed: 04/13/2011 [facility] Patient Information 2015  [employer], [place]. This information is not intended to replace advice given to you by your health care provider. Make sure you discuss any questions you have with your health care provider.

## 2014-05-24 NOTE — ED Notes (Signed)
Pt c/o left aching posterior arm pain x 1 week, radiating to shoulder and back, pt noticed today swollen rash to left upper posterior/interior arm. Several large, raised bumps on arm, pt states bumps are aching and tingling.

## 2014-09-24 ENCOUNTER — Encounter (HOSPITAL_COMMUNITY): Payer: Self-pay | Admitting: *Deleted

## 2014-09-24 ENCOUNTER — Emergency Department (HOSPITAL_COMMUNITY)
Admission: EM | Admit: 2014-09-24 | Discharge: 2014-09-24 | Disposition: A | Discharge status: Home / Self Care | Payer: Medicaid Other | Attending: Emergency Medicine | Admitting: Emergency Medicine

## 2014-09-24 DIAGNOSIS — S4992XA Unspecified injury of left shoulder and upper arm, initial encounter: Secondary | ICD-10-CM | POA: Diagnosis not present

## 2014-09-24 DIAGNOSIS — Y998 Other external cause status: Secondary | ICD-10-CM | POA: Diagnosis not present

## 2014-09-24 DIAGNOSIS — W1839XA Other fall on same level, initial encounter: Secondary | ICD-10-CM | POA: Insufficient documentation

## 2014-09-24 DIAGNOSIS — Z87891 Personal history of nicotine dependence: Secondary | ICD-10-CM | POA: Insufficient documentation

## 2014-09-24 DIAGNOSIS — Y9389 Activity, other specified: Secondary | ICD-10-CM | POA: Insufficient documentation

## 2014-09-24 DIAGNOSIS — Z8632 Personal history of gestational diabetes: Secondary | ICD-10-CM | POA: Diagnosis not present

## 2014-09-24 DIAGNOSIS — S5002XA Contusion of left elbow, initial encounter: Secondary | ICD-10-CM | POA: Diagnosis not present

## 2014-09-24 DIAGNOSIS — S29092A Other injury of muscle and tendon of back wall of thorax, initial encounter: Secondary | ICD-10-CM | POA: Insufficient documentation

## 2014-09-24 DIAGNOSIS — S199XXA Unspecified injury of neck, initial encounter: Secondary | ICD-10-CM | POA: Diagnosis not present

## 2014-09-24 DIAGNOSIS — Z79899 Other long term (current) drug therapy: Secondary | ICD-10-CM | POA: Diagnosis not present

## 2014-09-24 DIAGNOSIS — F329 Major depressive disorder, single episode, unspecified: Secondary | ICD-10-CM | POA: Insufficient documentation

## 2014-09-24 DIAGNOSIS — K219 Gastro-esophageal reflux disease without esophagitis: Secondary | ICD-10-CM | POA: Diagnosis not present

## 2014-09-24 DIAGNOSIS — Z87440 Personal history of urinary (tract) infections: Secondary | ICD-10-CM | POA: Diagnosis not present

## 2014-09-24 DIAGNOSIS — M546 Pain in thoracic spine: Secondary | ICD-10-CM

## 2014-09-24 DIAGNOSIS — Y92090 Kitchen in other non-institutional residence as the place of occurrence of the external cause: Secondary | ICD-10-CM | POA: Insufficient documentation

## 2014-09-24 MED ORDER — KETOROLAC TROMETHAMINE 60 MG/2ML IM SOLN
60.0000 mg | Freq: Once | INTRAMUSCULAR | Status: AC
  Administered 2014-09-24: 60 mg via INTRAMUSCULAR
  Filled 2014-09-24: qty 2

## 2014-09-24 MED ORDER — METHOCARBAMOL 500 MG PO TABS: 500.0000 mg | ORAL_TABLET | Freq: Two times a day (BID) | ORAL | Status: DC

## 2014-09-24 MED ORDER — TRAMADOL HCL 50 MG PO TABS: 50.0000 mg | ORAL_TABLET | Freq: Four times a day (QID) | ORAL | Status: DC | PRN

## 2014-09-24 MED ORDER — IBUPROFEN 400 MG PO TABS: 400.0000 mg | ORAL_TABLET | Freq: Four times a day (QID) | ORAL | Status: DC | PRN

## 2014-09-24 MED ORDER — DIAZEPAM 5 MG PO TABS
5.0000 mg | ORAL_TABLET | Freq: Once | ORAL | Status: AC
  Administered 2014-09-24: 5 mg via ORAL
  Filled 2014-09-24: qty 1

## 2014-09-24 NOTE — ED Provider Notes (Signed)
CSN: [id number]     Arrival date & time 09/24/14  1115 History  This chart was scribed for non-physician practitioner, Joycie PeekBenjamin Curtina Grills, PA-C working with Gilda Creasehristopher J Pollina, MD by Luisa DagoPriscilla Tutu, Medical Scribe. This patient was seen in room WTR6/WTR6 and the patient's care was started at 12:30 PM.  Chief Complaint  Patient presents with  . Fall  . Neck Pain  . Shoulder Pain   The history is provided by the patient and medical records. No language interpreter was used.   HPI Comments: Dana [name] is a 39 y.o. female who presents to the Emergency Department complaining of a fall that occurred yesterday. Pt states that she fell onto wet kitchen floor and is now complaining of neck pain and left shoulder pain. She denies any LOC or head trauma. Pt states that she has taken Ibuprofen without adequate relief. She has also tried to do some therapeutic stretching but the pain and tightness is not subsiding and she rates her current pain as a "7/10". Pain exacerbated by movement of the neck and shoulder. She denies any fever, chills, nausea, emesis, numbness, weakness, joint swelling  Past Medical History  Diagnosis Date  . High-risk pregnancy   . Preeclampsia     History only with first preg 2006  . GERD (gastroesophageal reflux disease)     ocasional with pregnancy only  . Pregnancy induced hypertension     first preg  . Gestational diabetes     History with previous pregnancy 2009  . Preterm labor     1st delivery  . Infection     UTI  . Depression     on meds- doing well   Past Surgical History  Procedure Laterality Date  . Cesarean section    . Wisdom tooth extraction    . Cesarean section  05/16/2012    Procedure: CESAREAN SECTION;  Surgeon: Lavina Hammanodd Meisinger, MD;  Location: [facility];  Service: Obstetrics;  Laterality: N/A;  . Dilation and curettage of uterus    . Therapeutic abortion    . Cesarean section N/A 11/13/2013    Procedure: CESAREAN SECTION;  Surgeon: Lavina Hammanodd  Meisinger, MD;  Location: [facility];  Service: Obstetrics;  Laterality: N/A;  1 1/2hrs OR time   Family History  Problem Relation Age of Onset  . Heart disease Father   . Heart disease Paternal Grandfather    History  Substance Use Topics  . Smoking status: Former Smoker -- 0.50 packs/day for 15 years    Types: Cigarettes    Quit date: 03/13/2013  . Smokeless tobacco: Never Used  . Alcohol Use: No   OB History    Gravida Para Term Preterm AB TAB SAB Ectopic Multiple Living   8 4 3 1 4 1 3   4      Review of Systems  Constitutional: Negative for fever and chills.  Gastrointestinal: Negative for nausea and vomiting.  Musculoskeletal: Positive for arthralgias and neck pain. Negative for myalgias and joint swelling.  Neurological: Negative for weakness and numbness.  All other systems reviewed and are negative.  Allergies  Flagyl  Home Medications   Prior to Admission medications   Medication Sig Start Date End Date Taking? Authorizing Provider  acetaminophen (TYLENOL) 500 MG tablet Take 1,000 mg by mouth 2 (two) times daily as needed (For pain.).     Historical Provider, MD  acyclovir (ZOVIRAX) 400 MG tablet Take 1 tablet (400 mg total) by mouth 4 (four) times daily. 05/24/14   Fayrene HelperBowie Tran,  PA-C  butorphanol (STADOL) 10 MG/ML nasal spray Place 1 spray into the nose every 3 (three) hours as needed (moderate to severe pain). 11/16/13   Lavina Hamman, MD  citalopram (CELEXA) 40 MG tablet Take 40 mg by mouth daily.    Historical Provider, MD  diphenhydrAMINE (BENADRYL) 25 mg capsule Take 25 mg by mouth at bedtime as needed for itching.     Historical Provider, MD  ibuprofen (ADVIL,MOTRIN) 400 MG tablet Take 1 tablet (400 mg total) by mouth every 6 (six) hours as needed. 09/24/14   Joycie Peek, PA-C  methocarbamol (ROBAXIN) 500 MG tablet Take 1 tablet (500 mg total) by mouth 2 (two) times daily. 09/24/14   Joycie Peek, PA-C  phenol (CHLORASEPTIC) 1.4 % LIQD Use as directed 2 sprays  in the mouth or throat as needed for throat irritation / pain. 04/04/14   Garlon Hatchet, PA-C  Prenatal Vit-Fe Fumarate-FA (PRENATAL MULTIVITAMIN) TABS tablet Take 1 tablet by mouth at bedtime.    Historical Provider, MD  ranitidine (ZANTAC) 150 MG capsule Take 150 mg by mouth every evening.    Historical Provider, MD  traMADol (ULTRAM) 50 MG tablet Take 1 tablet (50 mg total) by mouth every 6 (six) hours as needed. 09/24/14   Joycie Peek, PA-C   BP 144/69 mmHg  Pulse 76  Temp(Src) 97.7 F (36.5 C) (Oral)  Resp 20  SpO2 100%  LMP 09/10/2014  Physical Exam  Constitutional: She is oriented to person, place, and time. She appears well-developed and well-nourished. No distress.  HENT:  Head: Normocephalic and atraumatic.  Eyes: Conjunctivae and EOM are normal.  Neck: Neck supple.  Cardiovascular: Normal rate.   Pulmonary/Chest: Effort normal. No respiratory distress.  Musculoskeletal: Normal range of motion. She exhibits no edema.  Diffuse tenderness to thoracic paraspinal muscles bilaterally. No overt midline bony tenderness. No obvious lesions or deformities, no crepitus. Patient maintains full active range of motion of cervical, thoracic and lumbar spine. Maintains full range of motion of all 4 extremities. Very mild ecchymosis to left elbow. Distal pulses intact.  Neurological: She is alert and oriented to person, place, and time.  Cranial nerves II through XII grossly intact. Moves all 4 extremities without ataxia. Grip strength intact and equal bilaterally  Skin: Skin is warm and dry.  Psychiatric: She has a normal mood and affect. Her behavior is normal.  Nursing note and vitals reviewed.   ED Course  Procedures (including critical care time)  DIAGNOSTIC STUDIES: Oxygen Saturation is 100% on RA, normal by my interpretation.    COORDINATION OF CARE: 12:37 PM- Will give pt pain medication and muscle relaxants here in the ED. She states that her friend will be able to pick  her up and drive her home. pt lives in a recovery house and is not allowed to have any opiates. Pt advised of plan for treatment and pt agrees.  1:25 PM- Pt states pain is still present. Advised pt to continue with warm compresses to her neck. Return precautions discussed.   Medications  ketorolac (TORADOL) injection 60 mg (60 mg Intramuscular Given 09/24/14 1242)  diazepam (VALIUM) tablet 5 mg (5 mg Oral Given 09/24/14 1242)    Filed Vitals:   09/24/14 1135  BP: 144/69  Pulse: 76  Temp: 97.7 F (36.5 C)  TempSrc: Oral  Resp: 20  SpO2: 100%    MDM  Vitals stable - WNL -afebrile Pt resting comfortably in ED. reports some improvement with discomfort from medications received in ED. PE--normal  neuro exam, full range of motion of spine.  DDX--likely thoracic muscular skeletal strain. Will treat conservatively with pain medicines, muscle relaxers. Resource guide given so patient may establish primary care and follow-up accordingly. No evidence of other acute or emergent pathology at this time.  I discussed all relevant lab findings and imaging results with pt and they verbalized understanding. Discussed f/u with PCP within 48 hrs and return precautions, pt very amenable to plan.  Final diagnoses:  Bilateral thoracic back pain    I personally performed the services described in this documentation, which was scribed in my presence. The recorded information has been reviewed and is accurate.    Joycie Peek, PA-C 09/25/14 1921  Gilda Crease, MD 09/26/14 [phone]

## 2014-09-24 NOTE — Discharge Instructions (Signed)
Back Pain, Adult Low back pain is very common. About 1 in 5 people have back pain.The cause of low back pain is rarely dangerous. The pain often gets better over time.About half of people with a sudden onset of back pain feel better in just 2 weeks. About 8 in 10 people feel better by 6 weeks.  CAUSES Some common causes of back pain include:  Strain of the muscles or ligaments supporting the spine.  Wear and tear (degeneration) of the spinal discs.  Arthritis.  Direct injury to the back. DIAGNOSIS Most of the time, the direct cause of low back pain is not known.However, back pain can be treated effectively even when the exact cause of the pain is unknown.Answering your caregiver's questions about your overall health and symptoms is one of the most accurate ways to make sure the cause of your pain is not dangerous. If your caregiver needs more information, he or she may order lab work or imaging tests (X-rays or MRIs).However, even if imaging tests show changes in your back, this usually does not require surgery. HOME CARE INSTRUCTIONS For many people, back pain returns.Since low back pain is rarely dangerous, it is often a condition that people can learn to [facility] their own.   Remain active. It is stressful on the back to sit or stand in one place. Do not sit, drive, or stand in one place for more than 30 minutes at a time. Take short walks on level surfaces as soon as pain allows.Try to increase the length of time you walk each day.  Do not stay in bed.Resting more than 1 or 2 days can delay your recovery.  Do not avoid exercise or work.Your body is made to move.It is not dangerous to be active, even though your back may hurt.Your back will likely heal faster if you return to being active before your pain is gone.  Pay attention to your body when you bend and lift. Many people have less discomfortwhen lifting if they bend their knees, keep the load close to their bodies,and  avoid twisting. Often, the most comfortable positions are those that put less stress on your recovering back.  Find a comfortable position to sleep. Use a firm mattress and lie on your side with your knees slightly bent. If you lie on your back, put a pillow under your knees.  Only take over-the-counter or prescription medicines as directed by your caregiver. Over-the-counter medicines to reduce pain and inflammation are often the most helpful.Your caregiver may prescribe muscle relaxant drugs.These medicines help dull your pain so you can more quickly return to your normal activities and healthy exercise.  Put ice on the injured area.  Put ice in a plastic bag.  Place a towel between your skin and the bag.  Leave the ice on for 15-20 minutes, 03-04 times a day for the first 2 to 3 days. After that, ice and heat may be alternated to reduce pain and spasms.  Ask your caregiver about trying back exercises and gentle massage. This may be of some benefit.  Avoid feeling anxious or stressed.Stress increases muscle tension and can worsen back pain.It is important to recognize when you are anxious or stressed and learn ways to manage it.Exercise is a great option. SEEK MEDICAL CARE IF:  You have pain that is not relieved with rest or medicine.  You have pain that does not improve in 1 week.  You have new symptoms.  You are generally not feeling well. SEEK  IMMEDIATE MEDICAL CARE IF:   You have pain that radiates from your back into your legs.  You develop new bowel or bladder control problems.  You have unusual weakness or numbness in your arms or legs.  You develop nausea or vomiting.  You develop abdominal pain.  You feel faint. Document Released: 06/29/2005 Document Revised: 12/29/2011 Document Reviewed: 10/31/2013 [facility] Patient Information 2015 [place], [place]. This information is not intended to replace advice given to you by your health care provider. Make sure you  discuss any questions you have with your health care provider.  Arthralgia Your caregiver has diagnosed you as suffering from an arthralgia. Arthralgia means there is pain in a joint. This can come from many reasons including:  Bruising the joint which causes soreness (inflammation) in the joint.  Wear and tear on the joints which occur as we grow older (osteoarthritis).  Overusing the joint.  Various forms of arthritis.  Infections of the joint. Regardless of the cause of pain in your joint, most of these different pains respond to anti-inflammatory drugs and rest. The exception to this is when a joint is infected, and these cases are treated with antibiotics, if it is a bacterial infection. HOME CARE INSTRUCTIONS   Rest the injured area for as long as directed by your caregiver. Then slowly start using the joint as directed by your caregiver and as the pain allows. Crutches as directed may be useful if the ankles, knees or hips are involved. If the knee was splinted or casted, continue use and care as directed. If an stretchy or elastic wrapping bandage has been applied today, it should be removed and re-applied every 3 to 4 hours. It should not be applied tightly, but firmly enough to keep swelling down. Watch toes and feet for swelling, bluish discoloration, coldness, numbness or excessive pain. If any of these problems (symptoms) occur, remove the ace bandage and re-apply more loosely. If these symptoms persist, contact your caregiver or return to this location.  For the first 24 hours, keep the injured extremity elevated on pillows while lying down.  Apply ice for 15-20 minutes to the sore joint every couple hours while awake for the first half day. Then 03-04 times per day for the first 48 hours. Put the ice in a plastic bag and place a towel between the bag of ice and your skin.  Wear any splinting, casting, elastic bandage applications, or slings as instructed.  Only take  over-the-counter or prescription medicines for pain, discomfort, or fever as directed by your caregiver. Do not use aspirin immediately after the injury unless instructed by your physician. Aspirin can cause increased bleeding and bruising of the tissues.  If you were given crutches, continue to use them as instructed and do not resume weight bearing on the sore joint until instructed. Persistent pain and inability to use the sore joint as directed for more than 2 to 3 days are warning signs indicating that you should see a caregiver for a follow-up visit as soon as possible. Initially, a hairline fracture (break in bone) may not be evident on X-rays. Persistent pain and swelling indicate that further evaluation, non-weight bearing or use of the joint (use of crutches or slings as instructed), or further X-rays are indicated. X-rays may sometimes not show a small fracture until a week or 10 days later. Make a follow-up appointment with your own caregiver or one to whom we have referred you. A radiologist (specialist in reading X-rays) may read your  X-rays. Make sure you know how you are to obtain your X-ray results. Do not assume everything is normal if you do not hear from us. SEEK MEDICAL CARE IF: Bruising, swelling, or pain increases. SEEK IMMEDIATE MEDICAL CARE IF:   Your fingers or toes are numb or blue.  The pain is not responding to medications and continues to stay the same or get worse.  The pain in your joint becomes severe.  You develop a fever over 102 F (38.9 C).  It becomes impossible to move or use the joint. MAKE SURE YOU:   Understand these instructions.  Will watch your condition.  Will get help right away if you are not doing well or get worse. Document Released: 06/29/2005 Document Revised: 09/21/2011 Document Reviewed: 02/15/2008 [facility] Patient Information 2015 [place]. This information is not intended to replace advice given to you by your health care  provider. Make sure you discuss any questions you have with your health care provider.   Emergency Department Resource Guide 1) Find a Doctor and Pay Out of Pocket Although you won't have to find out who is covered by your insurance plan, it is a good idea to ask around and get recommendations. You will then need to call the office and see if the doctor you have chosen will accept you as a new patient and what types of options they offer for patients who are self-pay. Some doctors offer discounts or will set up payment plans for their patients who do not have insurance, but you will need to ask so you aren't surprised when you get to your appointment.  2) Contact Your Local Health Department Not all health departments have doctors that can see patients for sick visits, but many do, so it is worth a call to see if yours does. If you don't know where your local health department is, you can check in your phone book. The CDC also has a tool to help you locate your state's health department, and many state websites also have listings of all of their local health departments.  3) Find a Walk-in Clinic If your illness is not likely to be very severe or complicated, you may want to try a walk in clinic. These are popping up all over the country in pharmacies, drugstores, and shopping centers. They're usually staffed by nurse practitioners or physician assistants that have been trained to treat common illnesses and complaints. They're usually fairly quick and inexpensive. However, if you have serious medical issues or chronic medical problems, these are probably not your best option.  No Primary Care Doctor: - Call Health Connect at  [phone] - they can help you locate a primary care doctor that  accepts your insurance, provides certain services, etc. - Physician Referral Service- [phone]  Chronic Pain Problems: Organization         Address  Phone   Notes  [facility]  [phone]  [phone] Patients need to be referred by their primary care doctor.   Medication Assistance: Organization         Address  Phone   Notes  [facility] Medication [facility] [address] [place], [place] [phone] --Must be a resident of [place] -- Must have NO insurance coverage whatsoever (no Medicaid/ Medicare, etc.) -- The pt. MUST have a primary care doctor that directs their care regularly and follows them in the community   [facility]ist  [phone]   [address]  (  [phone]    Agencies that provide inexpensive medical care: Organization         Address  Phone   Notes  [facility]  [phone]   [facility]    [phone]   [facility] [address] [place], [place] [address] [phone]   [facility] [address] [place] [place] [phone]   Planned Parenthood    [phone]   [facility]    [phone]   [facility]  [address], [place] Phone:  [phone], Fax:  [phone] Hours of Operation:  9 am - 6 pm, M-F.  Also accepts Medicaid/Medicare and self-pay.  [facility]  [address], [place] Phone: [phone], Fax: [phone]. Hours of Operation:  8:30 am - 5:30 pm, M-F.  Also accepts Medicaid and self-pay.  [facility] [place] [address] [place] Phone: [phone]   [facility] [address] Natasha Bence [place], [place] [phone], Ext. [phone] Mondays & Thursdays: 7-9 AM.  First 15 patients are seen on a first come, first serve basis.    Medicaid-accepting [facility] Providers:  Organization         Address  Phone   Notes  [facility] [address], [place] [phone] Also accepts self-pay patients.  [facility] [address] Laurell Josephs [place], [place]   [phone]   [facility] [address], [place] [phone]   [facility] [address], [place] [phone]   Renaye Rakers [address], [place]   [phone] Only accepts [place] Access [place] patients after they have their name applied to their card.   Self-Pay (no insurance) in [facility]:  Organization         Address  Phone   Notes  Sickle Cell Patients, [facility] [address] [place], [place] [phone]   [facility] [address] [place], [place] [phone]   [facility] [place]  [address], [place] [phone]   [facility]/Dr. Osei-Bonsu  [address] [place] or [address], [place] [phone] Phone number for both [place] and [place] locations is the same.  [facility] [address] [place] [phone]   [facility] [address], [place] or [address] [phone]   [facility] [address] [place] [phone], phone; [phone], fax Sees patients 1st and 3rd Saturday of every month.  Must not qualify for public or private insurance (i.e. Medicaid, Medicare, [place] Health Choice, Veterans' Benefits)  Household income should be no more than 200% of the poverty level The clinic cannot treat you if you are pregnant or think you are pregnant  Sexually transmitted diseases are not treated at the clinic.    Dental Care: Organization         Address  Phone  Notes  [facility] Department of [facility] [address] [place], [place] [phone] Accepts children up to age 77 who are enrolled in [place] or [place] Health Choice; pregnant women with a Medicaid card; and children who have  applied for Medicaid or [place] Health Choice, but  were declined, whose parents can pay a reduced fee at time of service.  [facility] Department of [facility]  [address], [place] [phone] Accepts children up to age 73 who are enrolled in [place] or [place] Health Choice; pregnant women with a Medicaid card; and children who have applied for Medicaid or [place] Health Choice, but were declined, whose parents can pay a reduced fee at time of service.  [facility]  [address] [place], [place] [phone] Patients are seen by appointment only. Walk-ins are not accepted. [facility] will see patients 32 years of age and older. Monday - Tuesday (8am-5pm) Most Wednesdays (8:30-5pm) $30 per visit, cash only  [facility] Adult Dental Access PROGRAM  [address], [facility] [phone] Patients are seen by appointment only. Walk-ins are not accepted. [facility] will see patients 50 years of age and older. One Wednesday Evening (Monthly: Volunteer Based).  $30 per visit, cash only  [employer]  [phone] for adults; Children under age 67, call Graduate Pediatric Dentistry at [phone]. Children aged 10-14, please call [phone] to request a pediatric application.  Dental services are provided in all areas of dental care including fillings, crowns and bridges, complete and partial dentures, implants, gum treatment, root canals, and extractions. Preventive care is also provided. Treatment is provided to both adults and children. Patients are selected via a lottery and there is often a waiting list.   [facility] [address], [place]  [phone] [email or web address]   [facility] [address] [place], [place] [phone], Ext. [phone] Second and Fourth Thursday of each month, opens at 6:30 AM; Clinic ends at 9 AM.  Patients are seen on a first-come first-served basis, and a limited number are seen during each clinic.    [facility]  [address] Ether Griffins [place], [place] [phone]   Eligibility Requirements You must have lived in [place], [place], or [place] counties for at least the last three months.   You cannot be eligible for state or federal sponsored [place], including [employer], [place], or [employer].   You generally cannot be eligible for healthcare insurance through your employer.    How to apply: Eligibility screenings are held every Tuesday and Wednesday afternoon from 1:00 pm until 4:00 pm. You do not need an appointment for the interview!  [facility] [address] [place], [place] [phone]   [facility] Health Department  [phone]   [facility] Health Department  [phone]   [facility] Health Department  [phone]    Behavioral Health Resources in the Community: Intensive Outpatient Programs Organization         Address  Phone  Notes  [facility] [address], [place], [place] [phone]   [facility] Outpatient [address] [place], [place] [phone]   [facility] [address] [place], [place]  [phone]   [facility] [address],  [place], [place] [phone] or [phone]   Substance Abuse Resources Organization         Address  Phone  Notes  [facility]  [phone]   [facility]  [phone]   [facility]  [phone]   Floydene Flock  [phone]   Residential & Outpatient Substance  Abuse Program  [phone]   Psychological Services Organization         Address  Phone  Notes  [facility]  [phone]   [facility] Services  [phone]   [facility] Mental Health [phone] [address] [place] [phone] or [phone]    Mobile Crisis Teams Organization         Address  Phone  Notes  [facility], Mobile Crisis Care  Unit  [phone]   [facility]  [address] [place], [place] [phone]   Doristine Locks [address] [place] [phone]    Self-Help/Support Groups Organization         Address  Phone             Notes  [facility] [place] - variety of support groups  336- I7437963 Call for more information  Narcotics Anonymous (NA), [facility] [address], [employer] [place]  2 meetings at this location   Statistician         Address  Phone  Notes  [facility] [address],    [place]  [phone]   [facility]  [address] [place] [address], [place], [place] [phone]   [facility] [address] [place], [place] [phone] Admissions: 8am-3pm M-F  [facility] [address]    [place], [place] [phone]   [facility] [address] [place], [place], [place] [phone]   [facility] [address]  [place], [place] [phone]   [facility] - Intensive Outpatient [address] Laurell Josephs [address], [place], [place] [phone]   [facility] (Addiction [facility] [address]  [place], [place] [phone] or [phone]   [facility] ([facility]) [address], [place], [place] [phone] Accepts Medicaid  [facility] [address]  [place] [phone] Substance Abuse/Addiction Treatment   [facility] Organization         Address  Phone  Notes  [facility]  [phone]   Angie Fava, PhD [address] Ervin Knack [place], [place]   [phone] or [phone]   [facility]   [address] [place], [place] [phone]   [facility] [address] [place], [place] [phone] Insurance/Medicaid/sponsorship through [facility] and Families [address]                                     [place], [place] [phone] Therapy/tele-psych/case  [facility] [address], [place] [phone]    Dr. Lolly Mustache  [phone]   [facility]  [employer] [facility] Dept. 1) [address] [place] 2) [address], [place] 3)  [address], [place] [phone]  [phone]   [facility] Child Abuse Hotline 980-491-0492 or [phone] (After Hours)

## 2014-09-24 NOTE — ED Notes (Addendum)
Pt fell onto the kitchen floor yesterday, now complains of pain to her left shoulder and both sides of her neck/upper back. Pt denies head injury/loss of consciousness. Pain is 8/10 in neck/upper back. Pt states she fell due to floor being wet.

## 2016-02-11 ENCOUNTER — Emergency Department (HOSPITAL_COMMUNITY): Payer: Self-pay

## 2016-02-11 ENCOUNTER — Emergency Department (HOSPITAL_COMMUNITY)
Admission: EM | Admit: 2016-02-11 | Discharge: 2016-02-11 | Disposition: A | Discharge status: Home / Self Care | Payer: Self-pay | Attending: Emergency Medicine | Admitting: Emergency Medicine

## 2016-02-11 ENCOUNTER — Encounter (HOSPITAL_COMMUNITY): Payer: Self-pay | Admitting: *Deleted

## 2016-02-11 DIAGNOSIS — R51 Headache: Secondary | ICD-10-CM

## 2016-02-11 DIAGNOSIS — R509 Fever, unspecified: Secondary | ICD-10-CM

## 2016-02-11 DIAGNOSIS — R52 Pain, unspecified: Secondary | ICD-10-CM

## 2016-02-11 DIAGNOSIS — Z79899 Other long term (current) drug therapy: Secondary | ICD-10-CM | POA: Insufficient documentation

## 2016-02-11 DIAGNOSIS — Z87891 Personal history of nicotine dependence: Secondary | ICD-10-CM | POA: Insufficient documentation

## 2016-02-11 DIAGNOSIS — J189 Pneumonia, unspecified organism: Secondary | ICD-10-CM | POA: Insufficient documentation

## 2016-02-11 DIAGNOSIS — R519 Headache, unspecified: Secondary | ICD-10-CM

## 2016-02-11 LAB — CBC WITH DIFFERENTIAL/PLATELET
Basophils Absolute: 0 10*3/uL (ref 0.0–0.1)
Basophils Relative: 1 %
Eosinophils Absolute: 0.2 10*3/uL (ref 0.0–0.7)
Eosinophils Relative: 3 %
HEMATOCRIT: 37.9 % (ref 36.0–46.0)
HEMOGLOBIN: 12.7 g/dL (ref 12.0–15.0)
Lymphocytes Relative: 17 %
Lymphs Abs: 1.2 10*3/uL (ref 0.7–4.0)
MCH: 28.4 pg (ref 26.0–34.0)
MCHC: 33.5 g/dL (ref 30.0–36.0)
MCV: 84.8 fL (ref 78.0–100.0)
MONOS PCT: 10 %
Monocytes Absolute: 0.7 10*3/uL (ref 0.1–1.0)
NEUTROS ABS: 5 10*3/uL (ref 1.7–7.7)
Neutrophils Relative %: 69 %
Platelets: 202 10*3/uL (ref 150–400)
RBC: 4.47 MIL/uL (ref 3.87–5.11)
RDW: 14.2 % (ref 11.5–15.5)
WBC: 7.2 10*3/uL (ref 4.0–10.5)

## 2016-02-11 LAB — COMPREHENSIVE METABOLIC PANEL
ALBUMIN: 4.1 g/dL (ref 3.5–5.0)
ALK PHOS: 81 U/L (ref 38–126)
ALT: 33 U/L (ref 14–54)
ANION GAP: 7 (ref 5–15)
AST: 43 U/L — ABNORMAL HIGH (ref 15–41)
BILIRUBIN TOTAL: 0.6 mg/dL (ref 0.3–1.2)
BUN: 6 mg/dL (ref 6–20)
CALCIUM: 8.7 mg/dL — AB (ref 8.9–10.3)
CO2: 25 mmol/L (ref 22–32)
Chloride: 103 mmol/L (ref 101–111)
Creatinine, Ser: 0.69 mg/dL (ref 0.44–1.00)
GFR calc Af Amer: >60 mL/min (ref >60)
GFR calc non Af Amer: >60 mL/min (ref >60)
GLUCOSE: 107 mg/dL — AB (ref 65–99)
Potassium: 3.2 mmol/L — ABNORMAL LOW (ref 3.5–5.1)
Sodium: 135 mmol/L (ref 135–145)
Total Protein: 8.2 g/dL — ABNORMAL HIGH (ref 6.5–8.1)

## 2016-02-11 LAB — URINALYSIS, ROUTINE W REFLEX MICROSCOPIC
BILIRUBIN URINE: NEGATIVE
Glucose, UA: NEGATIVE mg/dL
KETONES UR: NEGATIVE mg/dL
Leukocytes, UA: NEGATIVE
NITRITE: NEGATIVE
PH: 7 (ref 5.0–8.0)
Protein, ur: NEGATIVE mg/dL
Specific Gravity, Urine: 1.006 (ref 1.005–1.030)

## 2016-02-11 LAB — RAPID STREP SCREEN (MED CTR MEBANE ONLY): Streptococcus, Group A Screen (Direct): NEGATIVE

## 2016-02-11 LAB — URINE MICROSCOPIC-ADD ON: WBC, UA: NONE SEEN WBC/hpf (ref 0–5)

## 2016-02-11 LAB — POC URINE PREG, ED: Preg Test, Ur: NEGATIVE

## 2016-02-11 LAB — LIPASE, BLOOD: LIPASE: 19 U/L (ref 11–51)

## 2016-02-11 MED ORDER — AZITHROMYCIN 250 MG PO TABS
500.0000 mg | ORAL_TABLET | Freq: Once | ORAL | Status: AC
  Administered 2016-02-11: 500 mg via ORAL
  Filled 2016-02-11: qty 2

## 2016-02-11 MED ORDER — AZITHROMYCIN 250 MG PO TABS
250.0000 mg | ORAL_TABLET | Freq: Every day | ORAL | 0 refills | Status: DC
Start: 2016-02-11 — End: 2016-02-11

## 2016-02-11 MED ORDER — CEPHALEXIN 500 MG PO CAPS: 500.0000 mg | ORAL_CAPSULE | Freq: Three times a day (TID) | ORAL | 0 refills | Status: DC

## 2016-02-11 MED ORDER — PROCHLORPERAZINE EDISYLATE 5 MG/ML IJ SOLN
10.0000 mg | Freq: Once | INTRAMUSCULAR | Status: AC
  Administered 2016-02-11: 10 mg via INTRAVENOUS
  Filled 2016-02-11: qty 2

## 2016-02-11 MED ORDER — ACETAMINOPHEN 325 MG PO TABS
650.0000 mg | ORAL_TABLET | Freq: Once | ORAL | Status: AC | PRN
  Administered 2016-02-11: 650 mg via ORAL
  Filled 2016-02-11: qty 2

## 2016-02-11 MED ORDER — DIPHENHYDRAMINE HCL 50 MG/ML IJ SOLN
25.0000 mg | Freq: Once | INTRAMUSCULAR | Status: AC
  Administered 2016-02-11: 25 mg via INTRAVENOUS
  Filled 2016-02-11: qty 1

## 2016-02-11 MED ORDER — POTASSIUM CHLORIDE CRYS ER 20 MEQ PO TBCR
40.0000 meq | EXTENDED_RELEASE_TABLET | Freq: Once | ORAL | Status: AC
  Administered 2016-02-11: 40 meq via ORAL
  Filled 2016-02-11: qty 2

## 2016-02-11 MED ORDER — CYCLOBENZAPRINE HCL 10 MG PO TABS: 10.0000 mg | ORAL_TABLET | Freq: Two times a day (BID) | ORAL | 0 refills | Status: DC | PRN

## 2016-02-11 MED ORDER — SODIUM CHLORIDE 0.9 % IV BOLUS (SEPSIS)
1000.0000 mL | Freq: Once | INTRAVENOUS | Status: AC
  Administered 2016-02-11: 1000 mL via INTRAVENOUS

## 2016-02-11 MED ORDER — KETOROLAC TROMETHAMINE 30 MG/ML IJ SOLN
30.0000 mg | Freq: Once | INTRAMUSCULAR | Status: AC
  Administered 2016-02-11: 30 mg via INTRAVENOUS
  Filled 2016-02-11: qty 1

## 2016-02-11 MED ORDER — DOXYCYCLINE HYCLATE 100 MG PO CAPS: 100.0000 mg | ORAL_CAPSULE | Freq: Two times a day (BID) | ORAL | 0 refills | Status: AC

## 2016-02-11 NOTE — ED Triage Notes (Signed)
Pt complains of headache, generalized body aches, decreased appetite and cough for the past 2 days. Pt states she has had the cough since April but feels the cough became worse 2 days ago.

## 2016-02-11 NOTE — Discharge Instructions (Signed)
Take ibuprofen 400mg  and tylenol 500mg  every 4 hours together for pain and fever

## 2016-02-11 NOTE — Progress Notes (Signed)
Pt taken to imaging per nursing

## 2016-02-11 NOTE — ED Notes (Signed)
Patient transported to X-ray 
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
## 2016-02-11 NOTE — ED Notes (Signed)
Patient understood discharge instructions.  She is A & O x4. 

## 2016-02-14 LAB — CULTURE, GROUP A STREP (THRC)

## 2016-03-02 ENCOUNTER — Emergency Department (HOSPITAL_COMMUNITY)
Admission: EM | Admit: 2016-03-02 | Discharge: 2016-03-02 | Disposition: A | Discharge status: Home / Self Care | Payer: Self-pay | Attending: Emergency Medicine | Admitting: Emergency Medicine

## 2016-03-02 ENCOUNTER — Encounter (HOSPITAL_COMMUNITY): Payer: Self-pay | Admitting: Emergency Medicine

## 2016-03-02 ENCOUNTER — Emergency Department (HOSPITAL_COMMUNITY)
Admission: EM | Admit: 2016-03-02 | Discharge: 2016-03-02 | Discharge status: Absent without leave | Payer: Medicaid Other

## 2016-03-02 ENCOUNTER — Emergency Department (HOSPITAL_COMMUNITY): Payer: Self-pay

## 2016-03-02 DIAGNOSIS — Y939 Activity, unspecified: Secondary | ICD-10-CM | POA: Insufficient documentation

## 2016-03-02 DIAGNOSIS — Z87891 Personal history of nicotine dependence: Secondary | ICD-10-CM | POA: Insufficient documentation

## 2016-03-02 DIAGNOSIS — W108XXA Fall (on) (from) other stairs and steps, initial encounter: Secondary | ICD-10-CM | POA: Insufficient documentation

## 2016-03-02 DIAGNOSIS — S92331A Displaced fracture of third metatarsal bone, right foot, initial encounter for closed fracture: Secondary | ICD-10-CM | POA: Insufficient documentation

## 2016-03-02 DIAGNOSIS — S62309A Unspecified fracture of unspecified metacarpal bone, initial encounter for closed fracture: Secondary | ICD-10-CM

## 2016-03-02 DIAGNOSIS — Y929 Unspecified place or not applicable: Secondary | ICD-10-CM | POA: Insufficient documentation

## 2016-03-02 DIAGNOSIS — Z791 Long term (current) use of non-steroidal anti-inflammatories (NSAID): Secondary | ICD-10-CM | POA: Insufficient documentation

## 2016-03-02 DIAGNOSIS — Z79899 Other long term (current) drug therapy: Secondary | ICD-10-CM | POA: Insufficient documentation

## 2016-03-02 DIAGNOSIS — Y999 Unspecified external cause status: Secondary | ICD-10-CM | POA: Insufficient documentation

## 2016-03-02 MED ORDER — OXYCODONE-ACETAMINOPHEN 5-325 MG PO TABS: 1.0000 | ORAL_TABLET | Freq: Three times a day (TID) | ORAL | 0 refills | Status: DC | PRN

## 2016-03-02 NOTE — ED Notes (Signed)
Patient transported to X-ray 

## 2016-03-02 NOTE — ED Provider Notes (Signed)
[facility] Provider Note   CSN: [id number] Arrival date & time: 03/02/16  2026  By signing my name below, I, Soijett Blue, attest that this documentation has been prepared under the direction and in the presence of Elizabeth SauerJaime Ward, PA-C Electronically Signed: Soijett Blue, ED Scribe. 03/02/16. 8:59 PM.    History   Chief Complaint Chief Complaint  Patient presents with  . Foot Pain    HPI Dana [name] is a 40 y.o. female who presents to the Emergency Department complaining of gradually worsening right foot pain onset 1 week ago. Pt notes that she fell down stairs and struck her right foot on a door. Pt reports that her right foot pain is worsened with standing and denies any alleviating factors. Pt states that her right foot pain radiates to her right shin. Pt denies injuring her right foot in the past. Pt is having associated symptoms of right foot swelling, right middle toe pain, and gait problem due to pain. She notes that she has tried buddy taping her right middle toe, tylenol, ibuprofen, elevating, and ice, with no relief of her symptoms. She denies color change, wound, rash, fever, chills, and any other symptoms.    The history is provided by the patient. No language interpreter was used.    Past Medical History:  Diagnosis Date  . Depression    on meds- doing well  . GERD (gastroesophageal reflux disease)    ocasional with pregnancy only  . Gestational diabetes    History with previous pregnancy 2009  . High-risk pregnancy   . Infection    UTI  . Preeclampsia    History only with first preg 2006  . Pregnancy induced hypertension    first preg  . Preterm labor    1st delivery    Patient Active Problem List   Diagnosis Date Noted  . S/P cesarean section 11/13/2013    Past Surgical History:  Procedure Laterality Date  . CESAREAN SECTION    . CESAREAN SECTION  05/16/2012   Procedure: CESAREAN SECTION;  Surgeon: Lavina Hammanodd Meisinger, MD;  Location: [facility];   Service: Obstetrics;  Laterality: N/A;  . CESAREAN SECTION N/A 11/13/2013   Procedure: CESAREAN SECTION;  Surgeon: Lavina Hammanodd Meisinger, MD;  Location: [facility];  Service: Obstetrics;  Laterality: N/A;  1 1/2hrs OR time  . DILATION AND CURETTAGE OF UTERUS    . THERAPEUTIC ABORTION    . WISDOM TOOTH EXTRACTION      OB History    Gravida Para Term Preterm AB Living   8 4 3 1 4 4    SAB TAB Ectopic Multiple Live Births   3 1     4        Home Medications    Prior to Admission medications   Medication Sig Start Date End Date Taking? Authorizing Provider  acetaminophen (TYLENOL) 500 MG tablet Take 1,000 mg by mouth 2 (two) times daily as needed (For pain.).     Historical Provider, MD  acyclovir (ZOVIRAX) 400 MG tablet Take 1 tablet (400 mg total) by mouth 4 (four) times daily. Patient not taking: Reported on 02/11/2016 05/24/14   Fayrene HelperBowie Tran, PA-C  butorphanol (STADOL) 10 MG/ML nasal spray Place 1 spray into the nose every 3 (three) hours as needed (moderate to severe pain). Patient not taking: Reported on 02/11/2016 11/16/13   Lavina Hammanodd Meisinger, MD  citalopram (CELEXA) 20 MG tablet Take 30 mg by mouth daily.    Historical Provider, MD  cyclobenzaprine (FLEXERIL) 10 MG tablet  Take 1 tablet (10 mg total) by mouth 2 (two) times daily as needed for muscle spasms. 02/11/16   Alvira MondayErin Schlossman, MD  ibuprofen (ADVIL,MOTRIN) 400 MG tablet Take 1 tablet (400 mg total) by mouth every 6 (six) hours as needed. Patient not taking: Reported on 02/11/2016 09/24/14   Joycie PeekBenjamin Cartner, PA-C  methocarbamol (ROBAXIN) 500 MG tablet Take 1 tablet (500 mg total) by mouth 2 (two) times daily. Patient not taking: Reported on 02/11/2016 09/24/14   Joycie PeekBenjamin Cartner, PA-C  oxyCODONE-acetaminophen (PERCOCET/ROXICET) 5-325 MG tablet Take 1 tablet by mouth every 8 (eight) hours as needed for severe pain. 03/02/16   Chase PicketJaime Pilcher Ward, PA-C  phenol (CHLORASEPTIC) 1.4 % LIQD Use as directed 2 sprays in the mouth or throat as needed for throat  irritation / pain. Patient not taking: Reported on 02/11/2016 04/04/14   Garlon HatchetLisa M Sanders, PA-C  traMADol (ULTRAM) 50 MG tablet Take 1 tablet (50 mg total) by mouth every 6 (six) hours as needed. Patient not taking: Reported on 02/11/2016 09/24/14   Joycie PeekBenjamin Cartner, PA-C    Family History Family History  Problem Relation Age of Onset  . Heart disease Father   . Heart disease Paternal Grandfather     Social History Social History  Substance Use Topics  . Smoking status: Former Smoker    Packs/day: 0.50    Years: 15.00    Types: Cigarettes    Quit date: 03/13/2013  . Smokeless tobacco: Never Used  . Alcohol use No     Allergies   Flagyl [metronidazole]   Review of Systems Review of Systems  Musculoskeletal: Positive for arthralgias (right foot), gait problem (due to pain) and joint swelling (right foot).  Skin: Negative for color change, rash and wound.     Physical Exam Updated Vital Signs BP 129/88 (BP Location: Right Arm)   Pulse 91   Temp 98.3 F (36.8 C)   Resp 18   LMP 01/31/2016   SpO2 100%   Physical Exam  Constitutional: She is oriented to person, place, and time. She appears well-developed and well-nourished. No distress.  HENT:  Head: Normocephalic and atraumatic.  Cardiovascular: Normal rate, regular rhythm, normal heart sounds and intact distal pulses.  Exam reveals no gallop and no friction rub.   No murmur heard. Pulmonary/Chest: Effort normal and breath sounds normal. No respiratory distress. She has no wheezes. She has no rales. She exhibits no tenderness.  Musculoskeletal: She exhibits no edema.  TTP and swelling of right forefoot. TTP worst over 3rd toe and metacarpal. No open wounds. Able to wiggle toes.  2+ distal pulse. Sensation intact.  Neurological: She is alert and oriented to person, place, and time.  Skin: Skin is warm and dry.  Nursing note and vitals reviewed.    ED Treatments / Results  DIAGNOSTIC STUDIES: Oxygen Saturation is 100%  on RA, nl by my interpretation.    COORDINATION OF CARE: 8:59 PM Discussed treatment plan with pt at bedside which includes right foot xray and pt agreed to plan.   Radiology Dg Foot Complete Right  Result Date: 03/02/2016 CLINICAL DATA:  Fall, pain. EXAM: RIGHT FOOT COMPLETE - 3+ VIEW COMPARISON:  None. FINDINGS: There is a displaced/comminuted fracture within the distal shaft of the third metatarsal bone. There is an additional slightly displaced obliquely-oriented fracture within the midshaft of the third proximal phalanx. Remainder of the osseous structures appear intact and normally aligned. Alignment at the metatarsophalangeal and interphalangeal joint spaces are normal. IMPRESSION: 1. Displaced/comminuted fracture within  the distal shaft of the third metatarsal bone. 2. Additional slightly displaced obliquely-oriented fracture within the midshaft of the third proximal phalanx. Electronically Signed   By: Bary Richard M.D.   On: 03/02/2016 21:38    Procedures Procedures (including critical care time)  Medications Ordered in ED Medications - No data to display   Initial Impression / Assessment and Plan / ED Course  I have reviewed the triage vital signs and the nursing notes.  Pertinent imaging results that were available during my care of the patient were reviewed by me and considered in my medical decision making (see chart for details).  Clinical Course   ROZALIA DINO is a 40 y.o. who presents to ED for worsening right foot pain x 1 week. 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 On exam, RLE is NVI. She does have exquisite tenderness to palpation over area of 3rd metacarpal with moderate amount of swelling to associated area. No open wounds. Will obtain x-ray to further assess.   X-ray obtained which shows a displaced/comminuted fracture within the distal shaft of the 3rd metatarsal as well as a slightly displaced obliquely-oriented  fracture within the midshaft of the third proximal phalanx. Images reviewed with attending, Dr. Read Drivers. Will place in post-op boot. Ortho follow up at next available appointment, preferably tomorrow or the next day. Rx for short course pain meds given. Reasons to return to ER discussed. Offered patient crutches which she declined. Ortho tech also offered crutches which she again declined. Rest and elevate as much as possible. Work note provided. All questions answered.   Patient discussed with Dr. Read Drivers who agrees with treatment plan.   Final Clinical Impressions(s) / ED Diagnoses   Final diagnoses:  Metacarpal bone fracture, closed, initial encounter    New Prescriptions New Prescriptions   OXYCODONE-ACETAMINOPHEN (PERCOCET/ROXICET) 5-325 MG TABLET    Take 1 tablet by mouth every 8 (eight) hours as needed for severe pain.    I personally performed the services described in this documentation, which was scribed in my presence. The recorded information has been reviewed and is accurate.    [facility] Ward, PA-C 03/02/16 2230    Paula Libra, MD 03/02/16 2330

## 2016-03-02 NOTE — ED Triage Notes (Signed)
Pt c/o right foot pain after "falling down the stairs" x1 week ago. Pt reports pain has gotten progressively worse and the "foot is swelling".

## 2016-03-02 NOTE — Discharge Instructions (Signed)
Please call the orthopedic physician listed in the morning to schedule a follow up appointment. Wear boot until you are seen by the orthopedic clinic. Take pain medication only as needed for severe pain - This can make you very drowsy - please do not drink alcohol, operate heavy machinery or drive on this medication. Tylenol and/or ibuprofen for mild to moderate pain.  Ice affected area and keep elevated as much as possible for additional pain relief.   COLD THERAPY DIRECTIONS:  Ice or gel packs can be used to reduce both pain and swelling. Ice is the most helpful within the first 24 to 48 hours after an injury or flareup from overusing a muscle or joint.  Ice is effective, has very few side effects, and is safe for most people to use.   If you expose your skin to cold temperatures for too long or without the proper protection, you can damage your skin or nerves. Watch for signs of skin damage due to cold.   HOME CARE INSTRUCTIONS  Follow these tips to use ice and cold packs safely.  Place a dry or damp towel between the ice and skin. A damp towel will cool the skin more quickly, so you may need to shorten the time that the ice is used.  For a more rapid response, add gentle compression to the ice.  Ice for no more than 10 to 20 minutes at a time. The bonier the area you are icing, the less time it will take to get the benefits of ice.  Check your skin after 5 minutes to make sure there are no signs of a poor response to cold or skin damage.  Rest 20 minutes or more in between uses.  Once your skin is numb, you can end your treatment. You can test numbness by very lightly touching your skin. The touch should be so light that you do not see the skin dimple from the pressure of your fingertip. When using ice, most people will feel these normal sensations in this order: cold, burning, aching, and numbness.

## 2016-03-02 NOTE — ED Notes (Signed)
Informed by registration patient left. 

## 2016-03-02 NOTE — ED Notes (Signed)
PA at bedside.

## 2016-12-08 ENCOUNTER — Encounter (HOSPITAL_COMMUNITY): Payer: Self-pay | Admitting: Emergency Medicine

## 2016-12-08 ENCOUNTER — Emergency Department (HOSPITAL_COMMUNITY)
Admission: EM | Admit: 2016-12-08 | Discharge: 2016-12-08 | Disposition: A | Discharge status: Home / Self Care | Payer: Medicaid Other | Attending: Emergency Medicine | Admitting: Emergency Medicine

## 2016-12-08 DIAGNOSIS — R51 Headache: Secondary | ICD-10-CM | POA: Diagnosis not present

## 2016-12-08 DIAGNOSIS — Z79899 Other long term (current) drug therapy: Secondary | ICD-10-CM | POA: Diagnosis not present

## 2016-12-08 DIAGNOSIS — Z0279 Encounter for issue of other medical certificate: Secondary | ICD-10-CM | POA: Insufficient documentation

## 2016-12-08 DIAGNOSIS — Z87891 Personal history of nicotine dependence: Secondary | ICD-10-CM | POA: Diagnosis not present

## 2016-12-08 DIAGNOSIS — R519 Headache, unspecified: Secondary | ICD-10-CM

## 2016-12-08 NOTE — ED Provider Notes (Signed)
[facility]-EMERGENCY DEPT Provider Note   CSN: [id number] Arrival date & time: 12/08/16  1328  By signing my name below, I, Paschal Dopp, attest that this documentation has been prepared under the direction and in the presence of [facility]. Electronically Signed: Paschal Dopp, Scribe. 12/08/2016. 3:31 PM.    History   Chief Complaint Chief Complaint  Patient presents with  . need work note   The history is provided by the patient. No language interpreter was used.    HPI Comments:  Dana [name] is a 41 y.o. female who presents to the Emergency Department requesting a work note. Pt checked into the ED yesterday with c/o a migraine; however, she LWBS at that time. She medicated at home w/ Tylenol and a cool rag and her migraine resolved following a nap. She is asymptomatic currently and is requesting a note to go back to work at the discretion of her employer. No other associated symptoms or complains at this time. Pt denies any HA, numbness, light-headedness, dizziness, nausea, vomiting, abdominal pain, or vision problems.   Past Medical History:  Diagnosis Date  . Depression    on meds- doing well  . GERD (gastroesophageal reflux disease)    ocasional with pregnancy only  . Gestational diabetes    History with previous pregnancy 2009  . High-risk pregnancy   . Infection    UTI  . Preeclampsia    History only with first preg 2006  . Pregnancy induced hypertension    first preg  . Preterm labor    1st delivery    Patient Active Problem List   Diagnosis Date Noted  . S/P cesarean section 11/13/2013    Past Surgical History:  Procedure Laterality Date  . CESAREAN SECTION    . CESAREAN SECTION  05/16/2012   Procedure: CESAREAN SECTION;  Surgeon: Lavina Hamman, MD;  Location: [facility] ORS;  Service: Obstetrics;  Laterality: N/A;  . CESAREAN SECTION N/A 11/13/2013   Procedure: CESAREAN SECTION;  Surgeon: Lavina Hamman, MD;  Location: [facility] ORS;  Service: Obstetrics;   Laterality: N/A;  1 1/2hrs OR time  . DILATION AND CURETTAGE OF UTERUS    . THERAPEUTIC ABORTION    . WISDOM TOOTH EXTRACTION      OB History    Gravida Para Term Preterm AB Living   8 4 3 1 4 4    SAB TAB Ectopic Multiple Live Births   3 1     4        Home Medications    Prior to Admission medications   Medication Sig Start Date End Date Taking? Authorizing Provider  acetaminophen (TYLENOL) 500 MG tablet Take 1,000 mg by mouth 2 (two) times daily as needed (For pain.).     [provider]  acyclovir (ZOVIRAX) 400 MG tablet Take 1 tablet (400 mg total) by mouth 4 (four) times daily. Patient not taking: Reported on 02/11/2016 05/24/14   Fayrene Helper, PA-C  butorphanol (STADOL) 10 MG/ML nasal spray Place 1 spray into the nose every 3 (three) hours as needed (moderate to severe pain). Patient not taking: Reported on 02/11/2016 11/16/13   Meisinger, Tawanna Cooler, MD  citalopram (CELEXA) 20 MG tablet Take 30 mg by mouth daily.    [provider]  cyclobenzaprine (FLEXERIL) 10 MG tablet Take 1 tablet (10 mg total) by mouth 2 (two) times daily as needed for muscle spasms. 02/11/16   Alvira Monday, MD  ibuprofen (ADVIL,MOTRIN) 400 MG tablet Take 1 tablet (400 mg total) by mouth every  6 (six) hours as needed. Patient not taking: Reported on 02/11/2016 09/24/14   Joycie Peekartner, Benjamin, PA-C  methocarbamol (ROBAXIN) 500 MG tablet Take 1 tablet (500 mg total) by mouth 2 (two) times daily. Patient not taking: Reported on 02/11/2016 09/24/14   Joycie Peekartner, Benjamin, PA-C  oxyCODONE-acetaminophen (PERCOCET/ROXICET) 5-325 MG tablet Take 1 tablet by mouth every 8 (eight) hours as needed for severe pain. 03/02/16   Ward, Chase PicketJaime Pilcher, PA-C  phenol (CHLORASEPTIC) 1.4 % LIQD Use as directed 2 sprays in the mouth or throat as needed for throat irritation / pain. Patient not taking: Reported on 02/11/2016 04/04/14   Garlon HatchetSanders, Lisa M, PA-C  traMADol (ULTRAM) 50 MG tablet Take 1 tablet (50 mg total) by mouth every 6 (six)  hours as needed. Patient not taking: Reported on 02/11/2016 09/24/14   Joycie Peekartner, Benjamin, PA-C    Family History Family History  Problem Relation Age of Onset  . Heart disease Father   . Heart disease Paternal Grandfather     Social History Social History  Substance Use Topics  . Smoking status: Former Smoker    Packs/day: 0.50    Years: 15.00    Types: Cigarettes    Quit date: 03/13/2013  . Smokeless tobacco: Never Used  . Alcohol use No     Allergies   Flagyl [metronidazole]   Review of Systems Review of Systems  Constitutional: Negative for fever.  Gastrointestinal: Negative for abdominal pain, nausea and vomiting.  Neurological: Negative for dizziness, weakness, light-headedness, numbness and headaches.  All other systems reviewed and are negative.    Physical Exam Updated Vital Signs BP 140/79 (BP Location: Right Arm)   Pulse 78   Temp 98.2 F (36.8 C) (Oral)   Resp 16   SpO2 100%   Physical Exam  Constitutional: She is oriented to person, place, and time. She appears well-developed and well-nourished.  HENT:  Head: Normocephalic and atraumatic.  Right Ear: Tympanic membrane normal.  Left Ear: Tympanic membrane normal.  Nose: Nose normal. Right sinus exhibits no maxillary sinus tenderness and no frontal sinus tenderness. Left sinus exhibits no maxillary sinus tenderness and no frontal sinus tenderness.  Mouth/Throat: Uvula is midline, oropharynx is clear and moist and mucous membranes are normal. No oropharyngeal exudate, posterior oropharyngeal edema, posterior oropharyngeal erythema or tonsillar abscesses.  Eyes: Conjunctivae and EOM are normal. Pupils are equal, round, and reactive to light. Right eye exhibits no discharge. Left eye exhibits no discharge. No scleral icterus.  Neck: Normal range of motion. Neck supple.  Cardiovascular: Normal rate, regular rhythm, normal heart sounds and intact distal pulses.   Pulmonary/Chest: Effort normal and breath  sounds normal. No respiratory distress. She has no wheezes. She has no rales. She exhibits no tenderness.  Abdominal: Soft. Bowel sounds are normal. She exhibits no distension and no mass. There is no tenderness. There is no rebound and no guarding.  Musculoskeletal: Normal range of motion. She exhibits no edema.  Lymphadenopathy:    She has no cervical adenopathy.  Neurological: She is alert and oriented to person, place, and time. She has normal strength. No cranial nerve deficit or sensory deficit. She displays a negative Romberg sign. Coordination normal.  Skin: Skin is warm and dry.  Nursing note and vitals reviewed.    ED Treatments / Results  DIAGNOSTIC STUDIES:  Oxygen Saturation is 100% on RA, nl by my interpretation.    COORDINATION OF CARE:  3:20 PM Discussed treatment plan with pt at bedside and pt agreed to plan.  Labs (all labs ordered are listed, but only abnormal results are displayed) Labs Reviewed - No data to display  EKG  EKG Interpretation None       Radiology No results found.  Procedures Procedures (including critical care time)  Medications Ordered in ED Medications - No data to display   Initial Impression / Assessment and Plan / ED Course  I have reviewed the triage vital signs and the nursing notes.  Pertinent labs & imaging results that were available during my care of the patient were reviewed by me and considered in my medical decision making (see chart for details).     Pt presents for note to return to work. Reports having headache yesterday, similar to migraines she has had in the past. Patient currently denies any pain or complaints at this time but notes her employer's request for her to be evaluated and have a note in order to return to work. VSS. Exam unremarkable. No neuro deficits. Patient has remained hemodynamically stable in the ED. I do not suspect SAH, ICH, intracranial lesion, meningitis, encephalopathy, hematoma, dissection  or encephalitis and do not think that cranial imaging is needed at this time.  Plan to discharge patient home with PCP follow-up as needed. Return precautions.  Final Clinical Impressions(s) / ED Diagnoses   Final diagnoses:  Acute nonintractable headache, unspecified headache type    New Prescriptions Discharge Medication List as of 12/08/2016  3:27 PM     I personally performed the services described in this documentation, which was scribed in my presence. The recorded information has been reviewed and is accurate.     Barrett Henle, PA-C 12/08/16 1615    Lavera Guise, MD 12/08/16 [phone]

## 2016-12-08 NOTE — ED Notes (Signed)
Patient is alert and oriented x3.  She was given DC instructions and follow up visit instructions.  Patient gave verbal understanding. She was DC ambulatory under her own power to home.  V/S stable.  He was not showing any signs of distress on DC 

## 2016-12-08 NOTE — ED Triage Notes (Signed)
Pt reports she needs a doctor's note to return to work after having a migraine yesterday. Pt not having any pain today.

## 2017-08-05 IMAGING — CR DG CHEST 2V
2 series · 2 of 2 positions shown · non-contrast
Comparison: None.

CLINICAL DATA: Shortness of breath, cough and congestion with fever
2 days.

EXAM:
CHEST  2 VIEW

[w chest pa]
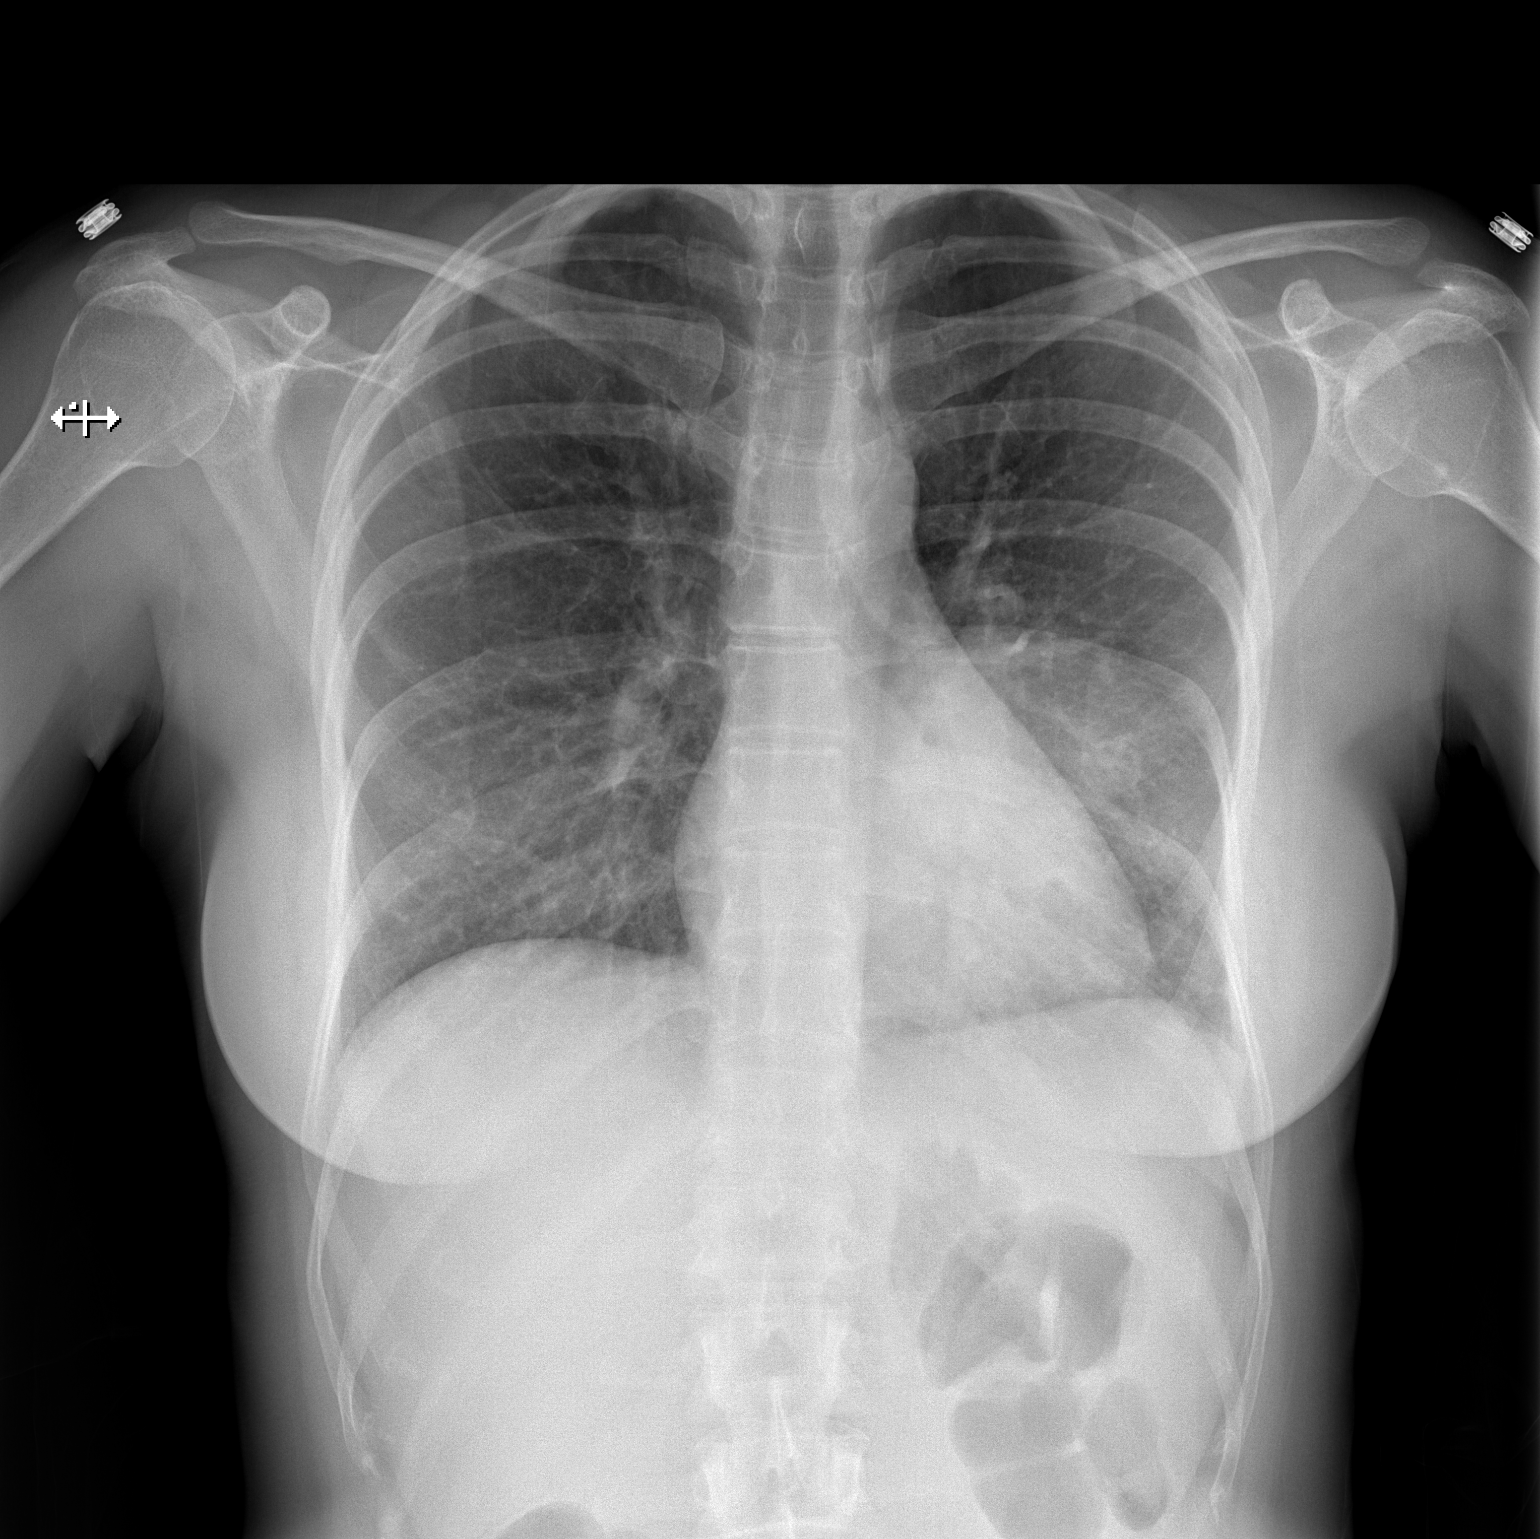

[w chest lat]
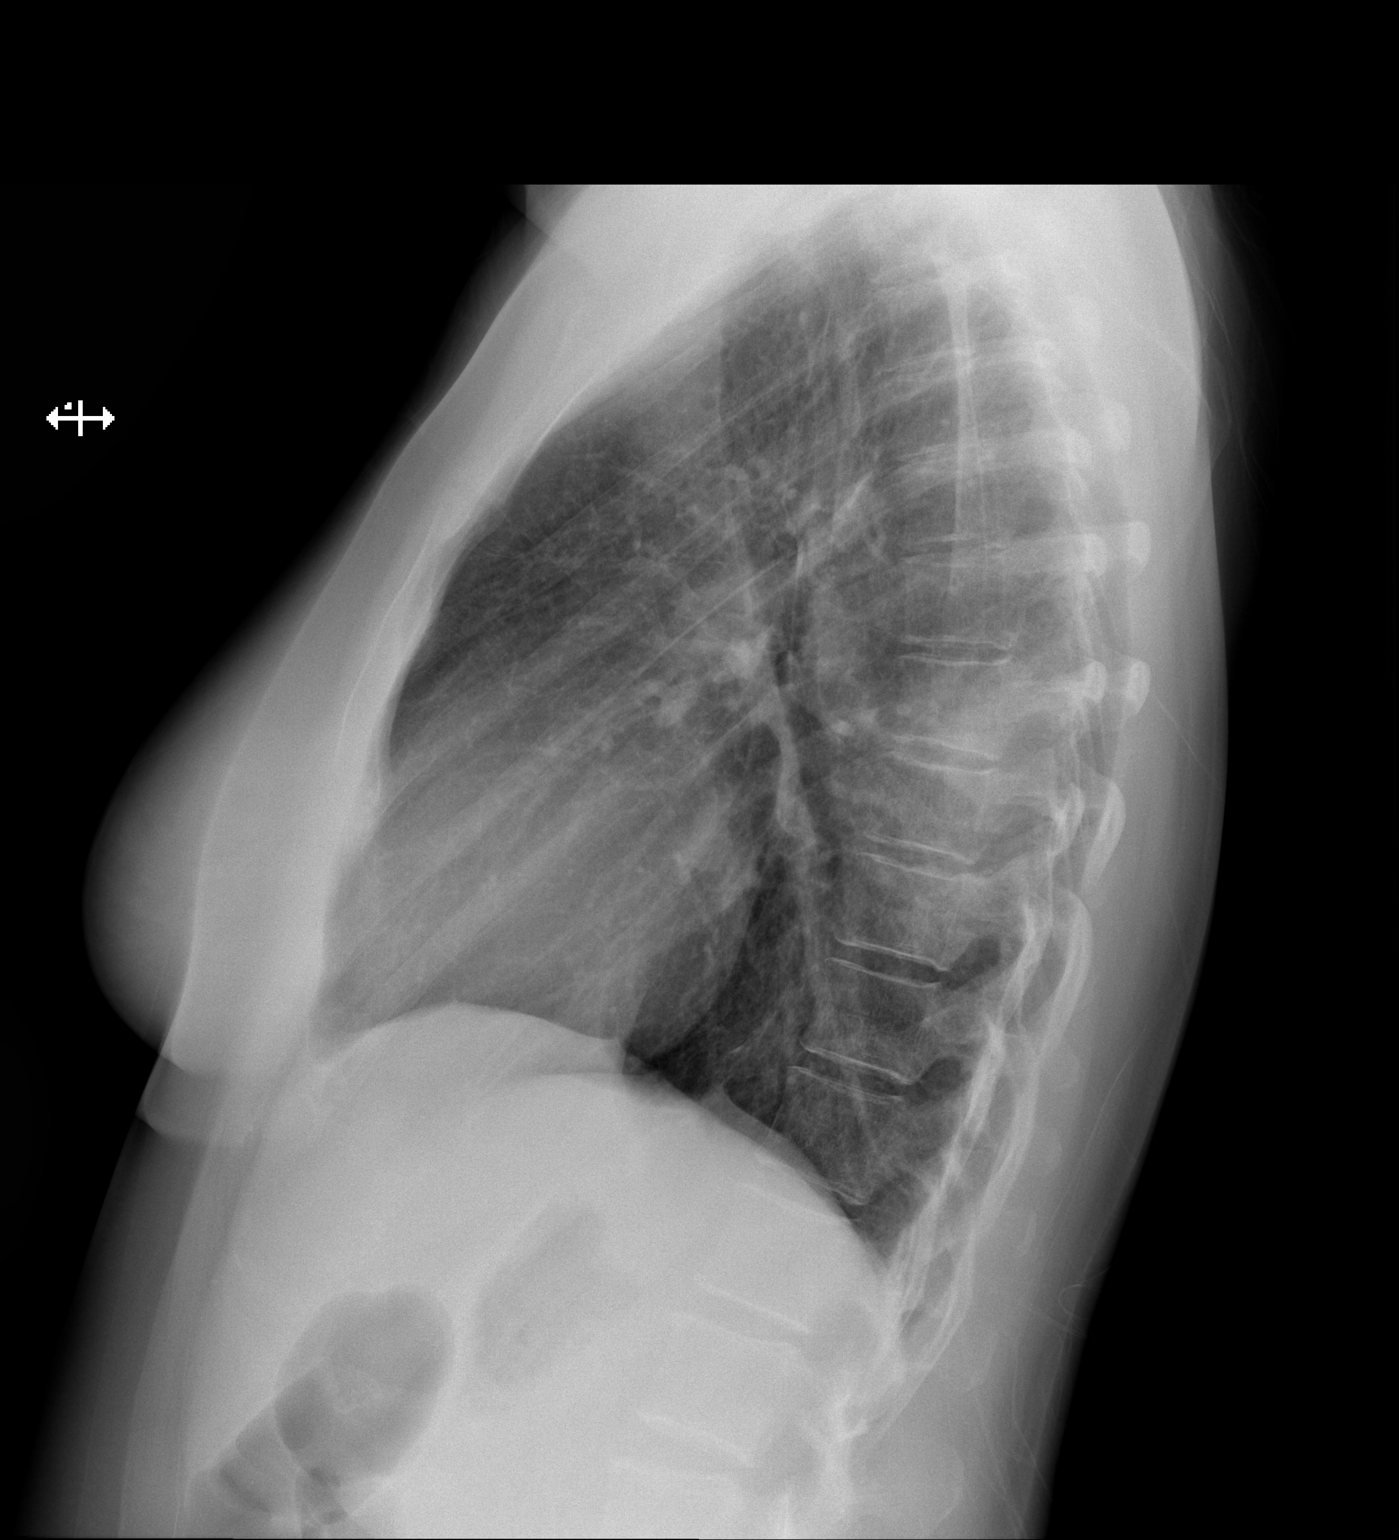

[2 of 2 positions shown; findings below may reference images not displayed]

FINDINGS: Lungs are adequately inflated and demonstrate moderate airspace
consolidation over the superior segment of the left lower lobe
likely pneumonia. No evidence of effusion. Cardiomediastinal
silhouette and remainder of the exam is within normal.
IMPRESSION: Airspace process over the left lower lobe likely a pneumonia.
Recommend follow-up chest radiograph 3-4 weeks

## 2017-10-04 ENCOUNTER — Emergency Department (HOSPITAL_COMMUNITY)
Admission: EM | Admit: 2017-10-04 | Discharge: 2017-10-04 | Disposition: A | Discharge status: Home / Self Care | Payer: Medicaid Other | Attending: Emergency Medicine | Admitting: Emergency Medicine

## 2017-10-04 ENCOUNTER — Emergency Department (HOSPITAL_COMMUNITY): Payer: Medicaid Other

## 2017-10-04 ENCOUNTER — Other Ambulatory Visit: Payer: Self-pay

## 2017-10-04 ENCOUNTER — Encounter (HOSPITAL_COMMUNITY): Payer: Self-pay

## 2017-10-04 DIAGNOSIS — B9789 Other viral agents as the cause of diseases classified elsewhere: Secondary | ICD-10-CM

## 2017-10-04 DIAGNOSIS — R05 Cough: Secondary | ICD-10-CM | POA: Insufficient documentation

## 2017-10-04 DIAGNOSIS — F17210 Nicotine dependence, cigarettes, uncomplicated: Secondary | ICD-10-CM | POA: Diagnosis not present

## 2017-10-04 DIAGNOSIS — J069 Acute upper respiratory infection, unspecified: Secondary | ICD-10-CM | POA: Diagnosis not present

## 2017-10-04 MED ORDER — BENZONATATE 100 MG PO CAPS: 100.0000 mg | ORAL_CAPSULE | Freq: Three times a day (TID) | ORAL | 0 refills | Status: DC | PRN

## 2017-10-04 NOTE — Discharge Instructions (Signed)
Please read instructions below. You can take tylenol as needed for sore throat or body aches. Drink plenty of water.  Use saline nasal spray to help with congestion and post-nasal drip. You can take tessalon every 8 hours as needed for ocugh. Follow up with your primary care provider if symptoms persist. Return to the ER for difficulty swallowing liquids, difficulty breathing, or new or worsening symptoms.

## 2017-10-04 NOTE — ED Notes (Signed)
Bed: WA01 Expected date:  Expected time:  Means of arrival:  Comments: 

## 2017-10-04 NOTE — ED Provider Notes (Signed)
[facility]-EMERGENCY DEPT Provider Note   CSN: [id number] Arrival date & time: 10/04/17  4098     History   Chief Complaint Chief Complaint  Patient presents with  . Cough  . Nasal Congestion    HPI Dana [name] is a 42 y.o. female with past medical history of depression, GERD, presenting to the ED with persistent productive cough for 11 days.  Patient states symptoms initially began with nasal congestion with lots of sinus pressure, and then she developed cough.  She states she still has nasal congestion with intermittent rhinorrhea, however the sinus pressure is not as bad.  States the cough is persistent, productive of green sputum.  She took Sudafed yesterday, however is not taking any regular over-the-counter medications for her symptoms.  Denies fever, ear pain, difficulty breathing.  She reports she is an every day smoker, however only smokes about 5 cigarettes/day.   The history is provided by the patient.    Past Medical History:  Diagnosis Date  . Depression    on meds- doing well  . GERD (gastroesophageal reflux disease)    ocasional with pregnancy only  . Gestational diabetes    History with previous pregnancy 2009  . High-risk pregnancy   . Infection    UTI  . Preeclampsia    History only with first preg 2006  . Pregnancy induced hypertension    first preg  . Preterm labor    1st delivery    Patient Active Problem List   Diagnosis Date Noted  . S/P cesarean section 11/13/2013    Past Surgical History:  Procedure Laterality Date  . CESAREAN SECTION    . CESAREAN SECTION  05/16/2012   Procedure: CESAREAN SECTION;  Surgeon: Lavina Hamman, MD;  Location: [facility] OR[facility];  Service: Obstetrics;  Laterality: N/A;  . CESAREAN SECTION N/A 11/13/2013   Procedure: CESAREAN SECTION;  Surgeon: Lavina Hamman, MD;  Location: [facility] ORS;  Service: Obstetrics;  Laterality: N/A;  1 1/2hrs OR time  . DILATION AND CURETTAGE OF UTERUS    . THERAPEUTIC  ABORTION    . WISDOM TOOTH EXTRACTION       OB History    Gravida  8   Para  4   Term  3   Preterm  1   AB  4   Living  4     SAB  3   TAB  1   Ectopic      Multiple      Live Births  4            Home Medications    Prior to Admission medications   Medication Sig Start Date End Date Taking? Authorizing Provider  acetaminophen (TYLENOL) 500 MG tablet Take 1,000 mg by mouth 2 (two) times daily as needed (For pain.).    Yes [provider]  benzonatate (TESSALON) 100 MG capsule Take 1 capsule (100 mg total) by mouth every 8 (eight) hours as needed for cough. 10/04/17   Robinson, [place] N, PA-C    Family History Family History  Problem Relation Age of Onset  . Heart disease Father   . Heart disease Paternal Grandfather     Social History Social History   Tobacco Use  . Smoking status: Current Every Day Smoker    Packs/day: 0.50    Years: 15.00    Pack years: 7.50    Types: Cigarettes    Last attempt to quit: 03/13/2013    Years since quitting: 4.5  .  Smokeless tobacco: Never Used  Substance Use Topics  . Alcohol use: No  . Drug use: No     Allergies   Flagyl [metronidazole]   Review of Systems Review of Systems  Constitutional: Negative for chills and fever.  HENT: Positive for congestion, rhinorrhea and sore throat. Negative for ear pain, trouble swallowing and voice change.   Respiratory: Positive for cough. Negative for shortness of breath.   All other systems reviewed and are negative.    Physical Exam Updated Vital Signs BP 129/80 (BP Location: Right Arm)   Pulse 89   Temp 98.3 F (36.8 C) (Oral)   Resp 18   Ht 5\' 4"  (1.626 m)   LMP 10/01/2017   SpO2 97%   BMI 27.12 kg/m   Physical Exam  Constitutional: She appears well-developed and well-nourished. No distress.  Well-appearing.  HENT:  Head: Normocephalic and atraumatic.  Right Ear: Tympanic membrane, external ear and ear canal normal.  Left Ear: Tympanic  membrane, external ear and ear canal normal.  Mouth/Throat: Uvula is midline and oropharynx is clear and moist. No trismus in the jaw. No uvula swelling.  Nose is congested  Eyes: Conjunctivae are normal.  Neck: Normal range of motion. Neck supple.  Cardiovascular: Normal rate, regular rhythm, normal heart sounds and intact distal pulses.  Pulmonary/Chest: Effort normal. No stridor. No respiratory distress. She has wheezes (mild wheezes at the bases.). She has no rales. She exhibits no tenderness.  Abdominal: Soft.  Lymphadenopathy:    She has no cervical adenopathy.  Neurological: She is alert.  Skin: Skin is warm.  Psychiatric: She has a normal mood and affect. Her behavior is normal.  Nursing note and vitals reviewed.    ED Treatments / Results  Labs (all labs ordered are listed, but only abnormal results are displayed) Labs Reviewed - No data to display  EKG None  Radiology Dg Chest 2 View  Result Date: 10/04/2017 CLINICAL DATA:  Eleven days of cough with onset of shortness of breath yesterday. Occasional smoker. EXAM: CHEST - 2 VIEW COMPARISON:  PA and lateral chest x-ray of February 11, 2016 FINDINGS: The lungs are well-expanded. There is no focal infiltrate. There is no pleural effusion. The heart and pulmonary vascularity are normal. The mediastinum is normal in width. The bony thorax is unremarkable. IMPRESSION: There is no active cardiopulmonary disease. Electronically Signed   By: David  [place] M.D.   On: 10/04/2017 11:07    Procedures Procedures (including critical care time)  Medications Ordered in ED Medications - No data to display   Initial Impression / Assessment and Plan / ED Course  I have reviewed the triage vital signs and the nursing notes.  Pertinent labs & imaging results that were available during my care of the patient were reviewed by me and considered in my medical decision making (see chart for details).     Patients symptoms are consistent with  URI, likely viral etiology. Afebrile, tolerating secretions.  CXR negative for acute infiltrate.  Discussed that antibiotics are not indicated for viral infections. Pt Dana be discharged with symptomatic treatment and antitussive. Counseled on smoking. Verbalizes understanding and is agreeable with plan. Pt is hemodynamically stable & in NAD prior to dc.  Discussed results, findings, treatment and follow up. Patient advised of return precautions. Patient verbalized understanding and agreed with plan.  Final Clinical Impressions(s) / ED Diagnoses   Final diagnoses:  Viral URI with cough    ED Discharge Orders  Ordered    benzonatate (TESSALON) 100 MG capsule  Every 8 hours PRN     10/04/17 1126       Robinson, [place] N, [place] JerseyPA-C 10/04/17 1127    Linwood DibblesKnapp, Jon, MD 10/05/17 [phone]

## 2017-10-04 NOTE — ED Triage Notes (Signed)
Patientt reports that she has had nasal congestion and a productive cough with green sputum x 11 days.

## 2019-03-01 ENCOUNTER — Emergency Department (HOSPITAL_COMMUNITY)
Admission: EM | Admit: 2019-03-01 | Discharge: 2019-03-02 | Disposition: A | Discharge status: Other Acute Inpatient Hospital | Payer: Medicaid Other | Attending: Emergency Medicine | Admitting: Emergency Medicine

## 2019-03-01 ENCOUNTER — Other Ambulatory Visit: Payer: Self-pay

## 2019-03-01 DIAGNOSIS — F17210 Nicotine dependence, cigarettes, uncomplicated: Secondary | ICD-10-CM | POA: Insufficient documentation

## 2019-03-01 DIAGNOSIS — R45851 Suicidal ideations: Secondary | ICD-10-CM | POA: Insufficient documentation

## 2019-03-01 DIAGNOSIS — Z5900 Homelessness unspecified: Secondary | ICD-10-CM

## 2019-03-01 DIAGNOSIS — Z20828 Contact with and (suspected) exposure to other viral communicable diseases: Secondary | ICD-10-CM | POA: Diagnosis not present

## 2019-03-01 DIAGNOSIS — Z046 Encounter for general psychiatric examination, requested by authority: Secondary | ICD-10-CM | POA: Insufficient documentation

## 2019-03-01 DIAGNOSIS — F329 Major depressive disorder, single episode, unspecified: Secondary | ICD-10-CM | POA: Diagnosis not present

## 2019-03-01 LAB — COMPREHENSIVE METABOLIC PANEL
ALT: 33 U/L (ref 0–44)
AST: 39 U/L (ref 15–41)
Albumin: 3.7 g/dL (ref 3.5–5.0)
Alkaline Phosphatase: 63 U/L (ref 38–126)
Anion gap: 10 (ref 5–15)
BUN: 7 mg/dL (ref 6–20)
CO2: 24 mmol/L (ref 22–32)
Calcium: 9.3 mg/dL (ref 8.9–10.3)
Chloride: 105 mmol/L (ref 98–111)
Creatinine, Ser: 0.61 mg/dL (ref 0.44–1.00)
GFR calc Af Amer: >60 mL/min (ref >60)
GFR calc non Af Amer: >60 mL/min (ref >60)
Glucose, Bld: 90 mg/dL (ref 70–99)
Potassium: 3.9 mmol/L (ref 3.5–5.1)
Sodium: 139 mmol/L (ref 135–145)
Total Bilirubin: 0.6 mg/dL (ref 0.3–1.2)
Total Protein: 6.9 g/dL (ref 6.5–8.1)

## 2019-03-01 LAB — CBC
HCT: 36 % (ref 36.0–46.0)
Hemoglobin: 11.9 g/dL — ABNORMAL LOW (ref 12.0–15.0)
MCH: 29.4 pg (ref 26.0–34.0)
MCHC: 33.1 g/dL (ref 30.0–36.0)
MCV: 88.9 fL (ref 80.0–100.0)
Platelets: 213 10*3/uL (ref 150–400)
RBC: 4.05 MIL/uL (ref 3.87–5.11)
RDW: 13.2 % (ref 11.5–15.5)
WBC: 6.4 10*3/uL (ref 4.0–10.5)
nRBC: 0 % (ref 0.0–0.2)

## 2019-03-01 LAB — RAPID URINE DRUG SCREEN, HOSP PERFORMED
Amphetamines: NOT DETECTED
Barbiturates: NOT DETECTED
Benzodiazepines: POSITIVE — AB
Cocaine: NOT DETECTED
Opiates: NOT DETECTED
Tetrahydrocannabinol: NOT DETECTED

## 2019-03-01 LAB — SARS CORONAVIRUS 2 BY RT PCR (HOSPITAL ORDER, PERFORMED IN ~~LOC~~ HOSPITAL LAB): SARS Coronavirus 2: NEGATIVE

## 2019-03-01 LAB — SALICYLATE LEVEL: Salicylate Lvl: <7 mg/dL (ref 2.8–30.0)

## 2019-03-01 LAB — I-STAT BETA HCG BLOOD, ED (MC, WL, AP ONLY): I-stat hCG, quantitative: <5 m[IU]/mL (ref <5)

## 2019-03-01 LAB — ACETAMINOPHEN LEVEL: Acetaminophen (Tylenol), Serum: <10 ug/mL — ABNORMAL LOW (ref 10–30)

## 2019-03-01 LAB — ETHANOL: Alcohol, Ethyl (B): <10 mg/dL (ref <10)

## 2019-03-01 MED ORDER — ALUM & MAG HYDROXIDE-SIMETH 200-200-20 MG/5ML PO SUSP: 30.0000 mL | Freq: Four times a day (QID) | ORAL | Status: DC | PRN

## 2019-03-01 MED ORDER — NICOTINE 14 MG/24HR TD PT24
14.0000 mg | MEDICATED_PATCH | Freq: Once | TRANSDERMAL | Status: DC
  Administered 2019-03-01: 14 mg via TRANSDERMAL
  Filled 2019-03-01: qty 1

## 2019-03-01 MED ORDER — ZOLPIDEM TARTRATE 5 MG PO TABS
5.0000 mg | ORAL_TABLET | Freq: Every evening | ORAL | Status: DC | PRN
  Administered 2019-03-01: 22:00:00 5 mg via ORAL
  Filled 2019-03-01: qty 1

## 2019-03-01 MED ORDER — IBUPROFEN 400 MG PO TABS: 600.0000 mg | ORAL_TABLET | Freq: Three times a day (TID) | ORAL | Status: DC | PRN

## 2019-03-01 MED ORDER — ONDANSETRON HCL 4 MG PO TABS: 4.0000 mg | ORAL_TABLET | Freq: Three times a day (TID) | ORAL | Status: DC | PRN

## 2019-03-01 NOTE — ED Notes (Signed)
Dinner tray ordered.

## 2019-03-01 NOTE — ED Notes (Signed)
Patient denies pain and is resting comfortably.  

## 2019-03-01 NOTE — ED Notes (Signed)
Patient spouse Audry Pili asking for a call back for update he is worried  [phone]

## 2019-03-01 NOTE — ED Notes (Signed)
Per [facility], pt may come over to the obs unit after midnight tonight.

## 2019-03-01 NOTE — ED Notes (Addendum)
[facility] called and ready to do pts eval. Computer moved to room.

## 2019-03-01 NOTE — ED Triage Notes (Signed)
Pt here for SI without plan x 1 week. Pt facing new homelessness and has a 41 year old and she sts "it's just too much."

## 2019-03-01 NOTE — ED Notes (Signed)
Belongings are in locker #2. Cell phone and wallet are with security.

## 2019-03-01 NOTE — BH Assessment (Addendum)
Tele Assessment Note   Patient Name: Dana [name] MRN: [id number] Referring Physician: Dr. Isla Pence.  Location of Patient: [facility], [phone]. Location of Provider: [place] is an 43 y.o. female, who presents voluntary and unaccompanied to [facility]. Clinician asked the pt, "what brought you to the hospital? Pt reported, "feeling overwhelmed." Pt reported, she, her husband and her 5 year old daughter were staying with a family friend (for 8 months) until the friends' family sold the house they were living in. Pt reported, for the past week she has been living with her sister-in law and feeling like an outcast. Pt reported, staying with different people for the past 2 years. Pt reported, "I have lost all motivation, I don't want to die, I have nothing left inside of me." Pt reported, "I have no desire to harm myself." Pt reported, "I feel paralyzed." Pt reported, she has not showered in weeks. Pt reported, she is depending on her husband to enroll their daughter in school because she does not have the energy. 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 Pt denies, SI, HI, AVH, self-injurious behaviors and access to weapons.   Pt denies abuse. Pt reported, less than half a pack of cigarettes, daily. Pt's UDS is positive for benzodiazepines. Pt denies, being linked to OPT resources (medication management and/or counseling.) Pt has previous inpatient admission in 2011 and 2012 at [facility] for depression.  Pt presents tearful, quiet, awake in scrubs with logical, coherent speech. Pt's eye contact was fair. Pt's mood was depressed. Pt's affect was flat. Pt's thought process was coherent, relevant. Pt's judgement was partial. Pt was oriented x4. Pt's concentration was normal. Pt's insight and impulse control was fair. Pt reported, if discharged from [facility] she could contract for safety. Pt reported, if inpatient  treatment was recommended she would sign-in voluntarily.   Diagnosis: Major Depressive Disorder, recurrent severe without psychotic features.   Past Medical History:  Past Medical History:  Diagnosis Date  . Depression    on meds- doing well  . GERD (gastroesophageal reflux disease)    ocasional with pregnancy only  . Gestational diabetes    History with previous pregnancy 2009  . High-risk pregnancy   . Infection    UTI  . Preeclampsia    History only with first preg 2006  . Pregnancy induced hypertension    first preg  . Preterm labor    1st delivery    Past Surgical History:  Procedure Laterality Date  . CESAREAN SECTION    . CESAREAN SECTION  05/16/2012   Procedure: CESAREAN SECTION;  Surgeon: Cheri Fowler, MD;  Location: [facility];  Service: Obstetrics;  Laterality: N/A;  . CESAREAN SECTION N/A 11/13/2013   Procedure: CESAREAN SECTION;  Surgeon: Cheri Fowler, MD;  Location: [facility];  Service: Obstetrics;  Laterality: N/A;  1 1/2hrs OR time  . DILATION AND CURETTAGE OF UTERUS    . THERAPEUTIC ABORTION    . WISDOM TOOTH EXTRACTION      Family History:  Family History  Problem Relation Age of Onset  . Heart disease Father   . Heart disease Paternal Grandfather     Social History:  reports that she has been smoking cigarettes. She has a 7.50 pack-year smoking history. She has never used smokeless tobacco. She reports that she does not drink alcohol or use drugs.  Additional Social History:  Alcohol / Drug Use  Pain Medications: See MAR Prescriptions: See MAR Over the Counter: See MAR History of alcohol / drug use?: Yes Substance #1 Name of Substance 1: Cigarettes. 1 - Age of First Use: UTA 1 - Amount (size/oz): Pt reported, less than half a pack. 1 - Frequency: Daily. 1 - Duration: Ongoing. 1 - Last Use / Amount: Daily. Substance #2 Name of Substance 2: Benzodiazepines. 2 - Age of First Use: UTA 2 - Amount (size/oz): Pt's UDS is positive for  benzodiazepines. 2 - Frequency: UTA 2 - Duration: UTA 2 - Last Use / Amount: UTA  CIWA: CIWA-Ar BP: (!) 140/93 Pulse Rate: (!) 101 COWS:    Allergies:  Allergies  Allergen Reactions  . Flagyl [Metronidazole] Rash    Home Medications: (Not in a hospital admission)   OB/GYN Status:  Patient's last menstrual period was 02/22/2019 (approximate).  General Assessment Data [facility] TTS Assessment: In system Is this a Tele or Face-to-Face Assessment?: Tele Assessment Is this an Initial Assessment or a Re-assessment for this encounter?: Initial Assessment Patient Accompanied by:: N/A Language Other than English: No Living Arrangements: Other (Comment)(sister-inlaw, husband and daughter. ) What gender do you identify as?: Female Marital status: Married RampartMaiden name: Harward. Living Arrangements: Children, Spouse/significant other, Other relatives Can pt return to current living arrangement?: Yes Admission Status: Voluntary Is patient capable of signing voluntary admission?: Yes Referral Source: Self/Family/Friend Insurance type: Self-pay.      Crisis Care Plan Living Arrangements: Children, Spouse/significant other, Other relatives Legal Guardian: Other:(Self.) Name of Psychiatrist: NA Name of Therapist: NA  Education Status Is patient currently in school?: No Is the patient employed, unemployed or receiving disability?: Unemployed  Risk to self with the past 6 months Suicidal Ideation: No(Pt denies. ) Has patient been a risk to self within the past 6 months prior to admission? : No(Pt denies. ) Suicidal Intent: No(Pt denies. ) Has patient had any suicidal intent within the past 6 months prior to admission? : No Is patient at risk for suicide?: No Suicidal Plan?: No(Pt denies. ) Has patient had any suicidal plan within the past 6 months prior to admission? : No(Pt denies. ) Access to Means: No(Pt denies. ) What has been your use of drugs/alcohol  within the last 12 months?: Benzodiazepines.  Previous Attempts/Gestures: No(Pt denies. ) How many times?: 0 Other Self Harm Risks: NA Triggers for Past Attempts: None known Intentional Self Injurious Behavior: None(Pt denies. ) Recent stressful life event(s): Other (Comment)(homelessness, unemployed, depressed. ) Persecutory voices/beliefs?: No Depression: Yes Depression Symptoms: Feeling worthless/self pity, Loss of interest in usual pleasures, Guilt, Fatigue, Isolating, Tearfulness, Insomnia, Despondent Suicide prevention information given to non-admitted patients: Not applicable  Risk to Others within the past 6 months Homicidal Ideation: No(Pt denies. ) Does patient have any lifetime risk of violence toward others beyond the six months prior to admission? : No(Pt denies. ) Thoughts of Harm to Others: No(Pt denies. ) Current Homicidal Intent: No Access to Homicidal Means: No(Pt denies. ) Identified Victim: NA History of harm to others?: No(Pt denies. ) Assessment of Violence: None Noted Violent Behavior Description: NA Does patient have access to weapons?: No(Pt denies. ) Criminal Charges Pending?: No Does patient have a court date: No Is patient on probation?: No  Psychosis Hallucinations: None noted(Pt denies. ) Delusions: None noted(Pt denies. )  Mental Status Report Appearance/Hygiene: In scrubs Eye Contact: Fair Motor Activity: Unremarkable Speech: Logical/coherent Level of Consciousness: Quiet/awake, Other (Comment)(Tearful.) Mood: Depressed Affect: Flat Anxiety Level: Panic Attacks Panic  attack frequency: Pt reported, once per week.  Most recent panic attack: Pt reported, today.  Thought Processes: Coherent, Relevant Judgement: Partial Orientation: Person, Place, Time, Situation Obsessive Compulsive Thoughts/Behaviors: None  Cognitive Functioning Concentration: Normal Memory: Recent Intact Is patient IDD: No Insight: Fair Impulse Control: Fair Appetite:  Poor Have you had any weight changes? : No Change Sleep: Decreased Total Hours of Sleep: (Pt reported, "up and down." ) Vegetative Symptoms: Staying in bed, Not bathing, Decreased grooming  ADLScreening [facility] Assessment Services) Patient's cognitive ability adequate to safely complete daily activities?: Yes Patient able to express need for assistance with ADLs?: Yes Independently performs ADLs?: Yes (appropriate for developmental age)  Prior Inpatient Therapy Prior Inpatient Therapy: Yes Prior Therapy Dates: 2011 and 2012. Prior Therapy Facilty/Provider(s[facility]  Reason for Treatment: Depression.   Prior Outpatient Therapy Prior Outpatient Therapy: No Does patient have an ACCT team?: No Does patient have Intensive In-House Services?  : No Does patient have [facility] services? : No Does patient have P4CC services?: No  ADL Screening (condition at time of admission) Patient's cognitive ability adequate to safely complete daily activities?: Yes Does the patient have difficulty seeing, even when wearing glasses/contacts?: Yes(Pt wears glasses.) Does the patient have difficulty concentrating, remembering, or making decisions?: Yes Patient able to express need for assistance with ADLs?: Yes Does the patient have difficulty dressing or bathing?: No Independently performs ADLs?: Yes (appropriate for developmental age) Does the patient have difficulty walking or climbing stairs?: No Weakness of Legs: None Weakness of Arms/Hands: None  Home Assistive Devices/Equipment Home Assistive Devices/Equipment: Eyeglasses    Abuse/Neglect Assessment (Assessment to be complete while patient is alone) Abuse/Neglect Assessment Can Be Completed: Yes Physical Abuse: Denies(Pt denies.) Verbal Abuse: Denies(Pt denies.) Sexual Abuse: Denies(Pt denies.) Exploitation of patient/patient's resources: Denies(Pt denies.) Self-Neglect: Denies(Pt denies.)     Advance Directives (For  Healthcare) Does Patient Have a Medical Advance Directive?: No          Disposition: Lerry Linerashaun Dixon, NP recommends pt to be admitted to OBS Unit. Per Hassie BruceKim, AC, RN pt can coe after 0000. Disposition discussed with Dr. Particia NearingHaviland and Drue Flirtandy, RN.    Disposition Initial Assessment Completed for this Encounter: Yes  This service was provided via telemedicine using a 2-way, interactive audio and video technology.  Names of all persons participating in this telemedicine service and their role in this encounter. Name: Franchot ErichsenKimberly D Pulsifer. Role: Patient.   Name: Redmond Pullingreylese D Saoirse Legere, MS, [facility], [facility]. Role: Counselor.          Redmond Pullingreylese D Agustin Swatek 03/01/2019 8:18 PM    Redmond Pullingreylese D Ledora Delker, MS, [facility], [facility] Triage Specialist [phone]

## 2019-03-01 NOTE — ED Provider Notes (Signed)
[facility] EMERGENCY DEPARTMENT Provider Note   CSN: [id number] Arrival date & time: 03/01/19  1423    History   Chief Complaint Chief Complaint  Patient presents with  . Suicidal    HPI Dana [name] is a 43 y.o. female.     Pt presents to the ED today with SI.  She said she feels extremely depressed and is afraid she will hurt herself.  She has a hx of depression.  No f/c.  No cough or sob.  She has been evicted from her home and is homeless.  Her son is with her husband.     Past Medical History:  Diagnosis Date  . Depression    on meds- doing well  . GERD (gastroesophageal reflux disease)    ocasional with pregnancy only  . Gestational diabetes    History with previous pregnancy 2009  . High-risk pregnancy   . Infection    UTI  . Preeclampsia    History only with first preg 2006  . Pregnancy induced hypertension    first preg  . Preterm labor    1st delivery    Patient Active Problem List   Diagnosis Date Noted  . S/P cesarean section 11/13/2013    Past Surgical History:  Procedure Laterality Date  . CESAREAN SECTION    . CESAREAN SECTION  05/16/2012   Procedure: CESAREAN SECTION;  Surgeon: Cheri Fowler, MD;  Location: [facility];  Service: Obstetrics;  Laterality: N/A;  . CESAREAN SECTION N/A 11/13/2013   Procedure: CESAREAN SECTION;  Surgeon: Cheri Fowler, MD;  Location: [facility];  Service: Obstetrics;  Laterality: N/A;  1 1/2hrs OR time  . DILATION AND CURETTAGE OF UTERUS    . THERAPEUTIC ABORTION    . WISDOM TOOTH EXTRACTION       OB History    Gravida  8   Para  4   Term  3   Preterm  1   AB  4   Living  4     SAB  3   TAB  1   Ectopic      Multiple      Live Births  4            Home Medications    Prior to Admission medications   Medication Sig Start Date End Date Taking? Authorizing Provider  acetaminophen (TYLENOL) 500 MG tablet Take 1,000 mg by mouth 2 (two) times daily as needed (For  pain.).     [provider]  benzonatate (TESSALON) 100 MG capsule Take 1 capsule (100 mg total) by mouth every 8 (eight) hours as needed for cough. 10/04/17   Robinson, [place] N, PA-C    Family History Family History  Problem Relation Age of Onset  . Heart disease Father   . Heart disease Paternal Grandfather     Social History Social History   Tobacco Use  . Smoking status: Current Every Day Smoker    Packs/day: 0.50    Years: 15.00    Pack years: 7.50    Types: Cigarettes    Last attempt to quit: 03/13/2013    Years since quitting: 5.9  . Smokeless tobacco: Never Used  Substance Use Topics  . Alcohol use: No  . Drug use: No     Allergies   Flagyl [metronidazole]   Review of Systems Review of Systems  Psychiatric/Behavioral: Positive for suicidal ideas.  All other systems reviewed and are negative.    Physical Exam Updated  Vital Signs BP (!) 140/93 (BP Location: Right Arm)   Pulse (!) 101   Temp 98.5 F (36.9 C) (Oral)   Resp 14   Ht 5\' 4"  (1.626 m)   Wt 52.2 kg   LMP 02/22/2019 (Approximate)   SpO2 100%   BMI 19.74 kg/m   Physical Exam Vitals signs and nursing note reviewed.  Constitutional:      Appearance: Normal appearance.  HENT:     Head: Normocephalic and atraumatic.     Right Ear: External ear normal.     Left Ear: External ear normal.     Nose: Nose normal.     Mouth/Throat:     Mouth: Mucous membranes are moist.  Eyes:     Extraocular Movements: Extraocular movements intact.     Conjunctiva/sclera: Conjunctivae normal.     Pupils: Pupils are equal, round, and reactive to light.  Neck:     Musculoskeletal: Normal range of motion and neck supple.  Cardiovascular:     Rate and Rhythm: Normal rate and regular rhythm.     Pulses: Normal pulses.     Heart sounds: Normal heart sounds.  Pulmonary:     Effort: Pulmonary effort is normal.     Breath sounds: Normal breath sounds.  Abdominal:     General: Abdomen is flat. Bowel  sounds are normal.  Musculoskeletal: Normal range of motion.  Skin:    General: Skin is warm.     Capillary Refill: Capillary refill takes less than 2 seconds.  Neurological:     General: No focal deficit present.     Mental Status: She is alert and oriented to person, place, and time.  Psychiatric:        Attention and Perception: Attention normal.        Mood and Affect: Mood is depressed.        Behavior: Behavior normal.        Thought Content: Thought content includes suicidal ideation.      ED Treatments / Results  Labs (all labs ordered are listed, but only abnormal results are displayed) Labs Reviewed  ACETAMINOPHEN LEVEL - Abnormal; Notable for the following components:      Result Value   Acetaminophen (Tylenol), Serum <10 (*)    All other components within normal limits  CBC - Abnormal; Notable for the following components:   Hemoglobin 11.9 (*)    All other components within normal limits  RAPID URINE DRUG SCREEN, HOSP PERFORMED - Abnormal; Notable for the following components:   Benzodiazepines POSITIVE (*)    All other components within normal limits  SARS CORONAVIRUS 2 (HOSPITAL ORDER, PERFORMED IN [facility])  COMPREHENSIVE METABOLIC PANEL  ETHANOL  SALICYLATE LEVEL  I-STAT BETA HCG BLOOD, ED ([facility], [facility], [facility] ONLY)    EKG None  Radiology No results found.  Procedures Procedures (including critical care time)  Medications Ordered in ED Medications  ibuprofen (ADVIL) tablet 600 mg (has no administration in time range)  ondansetron (ZOFRAN) tablet 4 mg (has no administration in time range)  alum & mag hydroxide-simeth (MAALOX/MYLANTA) 200-200-20 MG/5ML suspension 30 mL (has no administration in time range)  nicotine (NICODERM CQ - dosed in mg/24 hours) patch 14 mg (14 mg Transdermal Patch Applied 03/01/19 1728)  zolpidem (AMBIEN) tablet 5 mg (has no administration in time range)     Initial Impression / Assessment and Plan / ED Course  I  have reviewed the triage vital signs and the nursing notes.  Pertinent labs &  imaging results that were available during my care of the patient were reviewed by me and considered in my medical decision making (see chart for details).    Pt is medically clear for TTS consult.  Final Clinical Impressions(s) / ED Diagnoses   Final diagnoses:  Suicidal ideation  Homelessness    ED Discharge Orders    None       Jacalyn LefevreHaviland, Aislyn Hayse, MD 03/01/19 2112

## 2019-03-01 NOTE — ED Notes (Signed)
TTS in progress 

## 2019-03-01 NOTE — ED Notes (Signed)
Pt eating dinner

## 2019-03-01 NOTE — ED Notes (Signed)
Pt sleeping. 

## 2019-03-02 ENCOUNTER — Inpatient Hospital Stay (HOSPITAL_COMMUNITY)
Admission: AD | Admit: 2019-03-02 | Discharge: 2019-03-07 | DRG: 885 | Disposition: A | Discharge status: Home / Self Care | Payer: Medicaid Other | Source: Intra-hospital | Attending: Psychiatry | Admitting: Psychiatry

## 2019-03-02 ENCOUNTER — Encounter (HOSPITAL_COMMUNITY): Payer: Self-pay

## 2019-03-02 ENCOUNTER — Observation Stay (HOSPITAL_COMMUNITY)
Admission: AD | Admit: 2019-03-02 | Discharge: 2019-03-02 | Disposition: A | Discharge status: Other Acute Inpatient Hospital | Payer: Federal, State, Local not specified - Other | Source: Intra-hospital | Attending: Psychiatry | Admitting: Psychiatry

## 2019-03-02 ENCOUNTER — Other Ambulatory Visit: Payer: Self-pay

## 2019-03-02 ENCOUNTER — Encounter (HOSPITAL_COMMUNITY): Payer: Self-pay | Admitting: Nurse Practitioner

## 2019-03-02 DIAGNOSIS — F332 Major depressive disorder, recurrent severe without psychotic features: Secondary | ICD-10-CM | POA: Diagnosis not present

## 2019-03-02 DIAGNOSIS — Z8249 Family history of ischemic heart disease and other diseases of the circulatory system: Secondary | ICD-10-CM

## 2019-03-02 DIAGNOSIS — F339 Major depressive disorder, recurrent, unspecified: Principal | ICD-10-CM | POA: Diagnosis present

## 2019-03-02 DIAGNOSIS — R45851 Suicidal ideations: Secondary | ICD-10-CM | POA: Diagnosis not present

## 2019-03-02 DIAGNOSIS — G4700 Insomnia, unspecified: Secondary | ICD-10-CM | POA: Diagnosis present

## 2019-03-02 DIAGNOSIS — Z888 Allergy status to other drugs, medicaments and biological substances status: Secondary | ICD-10-CM | POA: Diagnosis not present

## 2019-03-02 DIAGNOSIS — K219 Gastro-esophageal reflux disease without esophagitis: Secondary | ICD-10-CM | POA: Diagnosis present

## 2019-03-02 DIAGNOSIS — F17210 Nicotine dependence, cigarettes, uncomplicated: Secondary | ICD-10-CM | POA: Diagnosis present

## 2019-03-02 DIAGNOSIS — Z818 Family history of other mental and behavioral disorders: Secondary | ICD-10-CM

## 2019-03-02 DIAGNOSIS — Z590 Homelessness: Secondary | ICD-10-CM

## 2019-03-02 DIAGNOSIS — F329 Major depressive disorder, single episode, unspecified: Secondary | ICD-10-CM | POA: Diagnosis present

## 2019-03-02 DIAGNOSIS — Z79899 Other long term (current) drug therapy: Secondary | ICD-10-CM | POA: Diagnosis not present

## 2019-03-02 DIAGNOSIS — Z8632 Personal history of gestational diabetes: Secondary | ICD-10-CM

## 2019-03-02 DIAGNOSIS — F419 Anxiety disorder, unspecified: Secondary | ICD-10-CM | POA: Diagnosis present

## 2019-03-02 MED ORDER — MIRTAZAPINE 7.5 MG PO TABS
7.5000 mg | ORAL_TABLET | Freq: Every day | ORAL | Status: DC
  Administered 2019-03-02 – 2019-03-04 (×3): 7.5 mg via ORAL
  Filled 2019-03-02 (×7): qty 1

## 2019-03-02 MED ORDER — MAGNESIUM HYDROXIDE 400 MG/5ML PO SUSP: 30.0000 mL | Freq: Every day | ORAL | Status: DC | PRN

## 2019-03-02 MED ORDER — LORAZEPAM 0.5 MG PO TABS
0.5000 mg | ORAL_TABLET | Freq: Four times a day (QID) | ORAL | Status: DC | PRN
  Administered 2019-03-04 – 2019-03-06 (×3): 0.5 mg via ORAL
  Filled 2019-03-02 (×3): qty 1

## 2019-03-02 MED ORDER — HYDROXYZINE HCL 25 MG PO TABS: 25.0000 mg | ORAL_TABLET | Freq: Four times a day (QID) | ORAL | Status: DC | PRN

## 2019-03-02 MED ORDER — NICOTINE 21 MG/24HR TD PT24
21.0000 mg | MEDICATED_PATCH | Freq: Every day | TRANSDERMAL | Status: DC
  Administered 2019-03-02 – 2019-03-07 (×6): 21 mg via TRANSDERMAL
  Filled 2019-03-02 (×8): qty 1

## 2019-03-02 MED ORDER — ALUM & MAG HYDROXIDE-SIMETH 200-200-20 MG/5ML PO SUSP: 30.0000 mL | ORAL | Status: DC | PRN

## 2019-03-02 MED ORDER — ACETAMINOPHEN 325 MG PO TABS: 650.0000 mg | ORAL_TABLET | Freq: Four times a day (QID) | ORAL | Status: DC | PRN

## 2019-03-02 MED ORDER — VENLAFAXINE HCL ER 37.5 MG PO CP24
37.5000 mg | ORAL_CAPSULE | Freq: Every day | ORAL | Status: DC
  Administered 2019-03-03: 37.5 mg via ORAL
  Filled 2019-03-02 (×3): qty 1

## 2019-03-02 MED ORDER — HYDROXYZINE HCL 25 MG PO TABS: 25.0000 mg | ORAL_TABLET | Freq: Three times a day (TID) | ORAL | Status: DC | PRN

## 2019-03-02 MED ORDER — TRAZODONE HCL 50 MG PO TABS: 50.0000 mg | ORAL_TABLET | Freq: Every evening | ORAL | Status: DC | PRN

## 2019-03-02 NOTE — Progress Notes (Signed)
Patient ID: DALICIA KISNER, female   DOB: 01/29/1976, 43 y.o.   MRN: [id number] Pt is a 43 yo female that presents voluntarily to [facility] on 03/02/2019 with worsening, depression, anxiety, hopelessness, and suicidal ideations. Pt states that she has become overwhelmed with not working, being homeless for 2 years, living with her sister in law, having a 30 year old, and not being able to find work because of [place]. Pt denies any plan. Pt denies si/hi/ah/vh at this time and verbally agrees to approach staff if these become apparent or before harming herself/others while at [place] denies any drug/alcohol/Rx abuse/use. Pt states she smokes 0.5-1 ppd. Pt denies a pcp. Pt denies past/present verbal/physical/sexual abuse. Pt is pleasant during the assessment, but sullen, sad, and anxious. Consents signed, skin/belongings search completed and patient oriented to unit. Patient stable at this time. Patient given the opportunity to express concerns and ask questions. Patient given toiletries. Will continue to monitor.   From a previous assessment:  JERRA HUCKEBY is an 43 y.o. female, who presents voluntary and unaccompanied to [facility]. Clinician asked the pt, "what brought you to the hospital? Pt reported, "feeling overwhelmed." Pt reported, she, her husband and her 81 year old daughter were staying with a family friend (for 8 months) until the friends' family sold the house they were living in. Pt reported, for the past week she has been living with her sister-in law and feeling like an outcast. Pt reported, staying with different people for the past 2 years. Pt reported, "I have lost all motivation, I don't want to die, I have nothing left inside of me." Pt reported, "I have no desire to harm myself." Pt reported, "I feel paralyzed." Pt reported, she has not showered in weeks. Pt reported, she is depending on her husband to enroll their daughter in school because she does not have the energy. 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 Pt denies, SI, HI, AVH, self-injurious behaviors and access to weapons.   Pt denies abuse. Pt reported, less than half a pack of cigarettes, daily. Pt's UDS is positive for benzodiazepines. Pt denies, being linked to OPT resources (medication management and/or counseling.) Pt has previous inpatient admission in 2011 and 2012 at [facility] for depression.

## 2019-03-02 NOTE — ED Notes (Signed)
[facility] called for transport. Will be here in about 20 minutes. All belongings from security and locker and will be sent with pt.

## 2019-03-02 NOTE — Progress Notes (Signed)
Patient ID: Dana [name], female   DOB: Dec 01, 1975, 43 y.o.   MRN: [id number] Patient is a 43 year old female admitted voluntarily to the Obs unit [facility].  Pt reports being depressed for a couple of weeks now, after moving with her husband and 29 year old daughter to live with her sister in law , after the family friend's home with whom they were living with prior, was sold.  Pt reports not showering in weeks due to lack of motivation, reports feeling suicidal, with no plan, and states that she does not want to die because of wanting to be there for her daughter.  Pt verbally contracts for safety on the unit, denies AVH/HI, Q15 minute checks maintained, will continue to monitor.

## 2019-03-02 NOTE — [facility] Suicide Risk Assessment (Signed)
[facility] Admission Suicide Risk Assessment   Nursing information obtained from:  Patient Demographic factors:  Caucasian, Unemployed, Low socioeconomic status Current Mental Status:  Suicidal ideation indicated by patient, Self-harm thoughts Loss Factors:  Decrease in vocational status, Financial problems / change in socioeconomic status Historical Factors:  Impulsivity Risk Reduction Factors:  Sense of responsibility to family, Positive social support, Positive coping skills or problem solving skills, Responsible for children under 14 years of age, Living with another person, especially a relative, Positive therapeutic relationship  Total Time spent with patient: 45 minutes Principal Problem:  MDD Diagnosis:  Active Problems:   Major depressive disorder, recurrent episode (Dana [name])  Subjective Data:   Continued Clinical Symptoms:  Alcohol Use Disorder Identification Test Final Score (AUDIT): 0 The "Alcohol Use Disorders Identification Test", Guidelines for Use in Primary Care, Second Edition.  World Pharmacologist [facility]). Score between 0-7:  no or low risk or alcohol related problems. Score between 8-15:  moderate risk of alcohol related problems. Score between 16-19:  high risk of alcohol related problems. Score 20 or above:  warrants further diagnostic evaluation for alcohol dependence and treatment.   CLINICAL FACTORS:  43 year old female, presents for worsening depression, neuro-vegetative symptoms, feeling overwhelmed, passive SI. No psychotic symptoms. Facing significant stressors, including financial and unstable housing . History of chronic depression, has not been on psychiatric medications x several years     Psychiatric Specialty Exam: Physical Exam  ROS  Last menstrual period 02/22/2019, currently breastfeeding.There is no height or weight on file to calculate BMI.  See admit note MSE                                                         COGNITIVE FEATURES THAT CONTRIBUTE TO RISK:  Closed-mindedness and Loss of executive function    SUICIDE RISK:   Moderate:  Frequent suicidal ideation with limited intensity, and duration, some specificity in terms of plans, no associated intent, good self-control, limited dysphoria/symptomatology, some risk factors present, and identifiable protective factors, including available and accessible social support.  PLAN OF CARE: Patient will be admitted to inpatient psychiatric unit for stabilization and safety. Will provide and encourage milieu participation. Provide medication management and maked adjustments as needed.  Will follow daily.    I certify that inpatient services furnished can reasonably be expected to improve the patient's condition.   Jenne Campus, MD 03/02/2019, 5:36 PM

## 2019-03-02 NOTE — Progress Notes (Signed)
Patient states that she is happy to be here since she had to spend the night on the observation unit. She states that she has not taken any meds since coming to this hospital. Her goal for tomorrow is to begin taking her medication.

## 2019-03-02 NOTE — H&P (Signed)
Psychiatric Admission Assessment Adult  Patient Identification: Dana ErichsenKimberly D [name] MRN:  [id number] Date of Evaluation:  03/02/2019 Chief Complaint:  " I am feeling overwhelmed" Principal Diagnosis:  MDD, no psychotic features  Diagnosis:  Active Problems:   Major depressive disorder, recurrent episode (HCC)  History of Present Illness: 43 year old female, presented to hospital voluntarily reporting worsening depression , feeling overwhelmed, neuro-vegetative symptoms of depression. She has had some passive SI but denies any suicidal plans or intentions and states " I know my family needs me". She reports " I don't want to die, but I have been feeling empty". She describes significant stressors, including  homelessness ( she and her husband are currently staying with his sister), difficulty paying for utilities due to which there has been no power at times ,  lack of transportation, unemployment,which has resulted in other issues such as not being able to enroll her daughter in school as not knowing where she will be residing. She describes a long history of depression, and states she has struggled with depression for years . She has not been on psychiatric medications for several years . Denies alcohol or drug abuse . Associated Signs/Symptoms: Depression Symptoms:  depressed mood, anhedonia, insomnia, suicidal thoughts without plan, loss of energy/fatigue, (Hypo) Manic Symptoms:  None noted or endorsed  Anxiety Symptoms:  Reports increased anxiety in the context of stressors Psychotic Symptoms:  Denies  PTSD Symptoms: Does not endorse  Total Time spent with patient: 45 minutes  Past Psychiatric History: history of a prior psychiatric admission for suicidal ideations. Denies history of self cutting. Denies history of suicidal attempts. Denies history of psychosis. Reports history of depression, which she characterizes as chronic and which she states waxes and wanes but is present  chronically. Denies history of mania or hypomania.  Does not endorse history of PTSD. Denies panic or agoraphobia, does endorse feeling she worries excessively. Denies history of violence .   Is the patient at risk to self? Yes.    Has the patient been a risk to self in the past 6 months? Yes.    Has the patient been a risk to self within the distant past? Yes.    Is the patient a risk to others? No.  Has the patient been a risk to others in the past 6 months? No.  Has the patient been a risk to others within the distant past? No.   Prior Inpatient Therapy:  as above  Prior Outpatient Therapy:  none recent or current  Alcohol Screening: 1. How often do you have a drink containing alcohol?: Never 2. How many drinks containing alcohol do you have on a typical day when you are drinking?: 1 or 2 3. How often do you have six or more drinks on one occasion?: Never AUDIT-C Score: 0 4. How often during the last year have you found that you were not able to stop drinking once you had started?: Never 5. How often during the last year have you failed to do what was normally expected from you becasue of drinking?: Never 6. How often during the last year have you needed a first drink in the morning to get yourself going after a heavy drinking session?: Never 7. How often during the last year have you had a feeling of guilt of remorse after drinking?: Never 8. How often during the last year have you been unable to remember what happened the night before because you had been drinking?: Never 9. Have you or  someone else been injured as a result of your drinking?: No 10. Has a relative or friend or a doctor or another health worker been concerned about your drinking or suggested you cut down?: No Alcohol Use Disorder Identification Test Final Score (AUDIT): 0 Substance Abuse History in the last 12 months: reports past history of alcohol abuse, went to a rehab in 2011, and reports she has been sober since  then. Denies drug abuse . Admission BAL negative, admission UDS positive for BZD ( reports she took Ativan ( not prescribed ) x 1 prior to admission, but states this was isolated instance and denies any pattern of BZD abuse  Consequences of Substance Abuse: Denies history of seizures, blackouts or of DUIs  Previous Psychotropic Medications: Reports she was on Celexa a few years ago, states " it worked for a while and then stopped working". Has not been on any psychiatric medications for several years . Remembers having been on Zoloft, Wellbutrin, Cymbalta in the past, but states " I don't think they worked" Psychological Evaluations:  No Past Medical History: History of Gestational Diabetes and Preeclampsia. Allergic to Metronidazole Past Medical History:  Diagnosis Date  . Depression    on meds- doing well  . GERD (gastroesophageal reflux disease)    ocasional with pregnancy only  . Gestational diabetes    History with previous pregnancy 2009  . High-risk pregnancy   . Infection    UTI  . Preeclampsia    History only with first preg 2006  . Pregnancy induced hypertension    first preg  . Preterm labor    1st delivery    Past Surgical History:  Procedure Laterality Date  . CESAREAN SECTION    . CESAREAN SECTION  05/16/2012   Procedure: CESAREAN SECTION;  Surgeon: Lavina Hamman, MD;  Location: [facility];  Service: Obstetrics;  Laterality: N/A;  . CESAREAN SECTION N/A 11/13/2013   Procedure: CESAREAN SECTION;  Surgeon: Lavina Hamman, MD;  Location: [facility];  Service: Obstetrics;  Laterality: N/A;  1 1/2hrs OR time  . DILATION AND CURETTAGE OF UTERUS    . THERAPEUTIC ABORTION    . WISDOM TOOTH EXTRACTION     Family History: parents alive , separated, has one older brother Family History  Problem Relation Age of Onset  . Heart disease Father   . Heart disease Paternal Grandfather    Family Psychiatric  History: mother has history of depression, no suicides in family, paternal  grandfather had history of alcoholism  Tobacco Screening:  smokes 1/2 PPD  Social History: married, four children. She lives with 41 year old daughter ( who is currently with the father). She has two children who live with her ex-husband. Also reports that one of her children had brain damage from an accident as an infant and was adopted . She is reports currently homeless and staying with husband's sister temporarily, currently not employed  Social History   Substance and Sexual Activity  Alcohol Use No     Social History   Substance and Sexual Activity  Drug Use No    Additional Social History:  Allergies:   Allergies  Allergen Reactions  . Flagyl [Metronidazole] Rash   Lab Results:  Results for orders placed or performed during the hospital encounter of 03/01/19 (from the past 48 hour(s))  Comprehensive metabolic panel     Status: None   Collection Time: 03/01/19  2:37 PM  Result Value Ref Range   Sodium 139 135 - 145 mmol/L  Potassium 3.9 3.5 - 5.1 mmol/L   Chloride 105 98 - 111 mmol/L   CO2 24 22 - 32 mmol/L   Glucose, Bld 90 70 - 99 mg/dL   BUN 7 6 - 20 mg/dL   Creatinine, Ser 1.610.61 0.44 - 1.00 mg/dL   Calcium 9.3 8.9 - 09.610.3 mg/dL   Total Protein 6.9 6.5 - 8.1 g/dL   Albumin 3.7 3.5 - 5.0 g/dL   AST 39 15 - 41 U/L   ALT 33 0 - 44 U/L   Alkaline Phosphatase 63 38 - 126 U/L   Total Bilirubin 0.6 0.3 - 1.2 mg/dL   GFR calc non Af Amer >60 >60 mL/min   GFR calc Af Amer >60 >60 mL/min   Anion gap 10 5 - 15    Comment: Performed at [facility], [address] [place], [place] [address]  Ethanol     Status: None   Collection Time: 03/01/19  2:37 PM  Result Value Ref Range   Alcohol, Ethyl (B) <10 <10 mg/dL    Comment: (NOTE) Lowest detectable limit for serum alcohol is 10 mg/dL. For medical purposes only. Performed at [facility], [address] [place], [place] [address]   Salicylate level     Status: None   Collection Time: 03/01/19  2:37 PM   Result Value Ref Range   Salicylate Lvl <7.0 2.8 - 30.0 mg/dL    Comment: Performed at [facility], [address] [place], [place] [address]  Acetaminophen level     Status: Abnormal   Collection Time: 03/01/19  2:37 PM  Result Value Ref Range   Acetaminophen (Tylenol), Serum <10 (L) 10 - 30 ug/mL    Comment: (NOTE) Therapeutic concentrations vary significantly. A range of 10-30 ug/mL  may be an effective concentration for many patients. However, some  are best treated at concentrations outside of this range. Acetaminophen concentrations >150 ug/mL at 4 hours after ingestion  and >50 ug/mL at 12 hours after ingestion are often associated with  toxic reactions. Performed at [facility], [address] [place], [place] [address]   cbc     Status: Abnormal   Collection Time: 03/01/19  2:37 PM  Result Value Ref Range   WBC 6.4 4.0 - 10.5 K/uL   RBC 4.05 3.87 - 5.11 MIL/uL   Hemoglobin 11.9 (L) 12.0 - 15.0 g/dL   HCT 30.836.0 65.736.0 - 84.646.0 %   MCV 88.9 80.0 - 100.0 fL   MCH 29.4 26.0 - 34.0 pg   MCHC 33.1 30.0 - 36.0 g/dL   RDW 96.213.2 95.211.5 - 84.115.5 %   Platelets 213 150 - 400 K/uL   nRBC 0.0 0.0 - 0.2 %    Comment: Performed at [facility], [address] [place], [place] [address]  I-Stat beta hCG blood, ED     Status: None   Collection Time: 03/01/19  2:57 PM  Result Value Ref Range   I-stat hCG, quantitative <5.0 <5 mIU/mL   Comment 3            Comment:   GEST. AGE      CONC.  (mIU/mL)   <=1 WEEK        5 - 50     2 WEEKS       50 - 500     3 WEEKS       100 - 10,000     4 WEEKS  1,000 - 30,000        FEMALE AND NON-PREGNANT FEMALE:     LESS THAN 5 mIU/mL   Rapid urine drug screen (hospital performed)     Status: Abnormal   Collection Time: 03/01/19  5:05 PM  Result Value Ref Range   Opiates NONE DETECTED NONE DETECTED   Cocaine NONE DETECTED NONE DETECTED   Benzodiazepines POSITIVE (A) NONE DETECTED   Amphetamines NONE DETECTED NONE DETECTED    Tetrahydrocannabinol NONE DETECTED NONE DETECTED   Barbiturates NONE DETECTED NONE DETECTED    Comment: (NOTE) DRUG SCREEN FOR MEDICAL PURPOSES ONLY.  IF CONFIRMATION IS NEEDED FOR ANY PURPOSE, NOTIFY LAB WITHIN 5 DAYS. LOWEST DETECTABLE LIMITS FOR URINE DRUG SCREEN Drug Class                     Cutoff (ng/mL) Amphetamine and metabolites    1000 Barbiturate and metabolites    200 Benzodiazepine                 200 Tricyclics and metabolites     300 Opiates and metabolites        300 Cocaine and metabolites        300 THC                            50 Performed at [facility], [address] [place], [place] [address]   SARS Coronavirus 2 [facility](Hospital order, Performed in [facility] hospital lab) Nasopharyngeal Nasopharyngeal Swab     Status: None   Collection Time: 03/01/19  5:30 PM   Specimen: Nasopharyngeal Swab  Result Value Ref Range   SARS Coronavirus 2 NEGATIVE NEGATIVE    Comment: (NOTE) If result is NEGATIVE SARS-CoV-2 target nucleic acids are NOT DETECTED. The SARS-CoV-2 RNA is generally detectable in upper and lower  respiratory specimens during the acute phase of infection. The lowest  concentration of SARS-CoV-2 viral copies this assay can detect is 250  copies / mL. A negative result does not preclude SARS-CoV-2 infection  and should not be used as the sole basis for treatment or other  patient management decisions.  A negative result may occur with  improper specimen collection / handling, submission of specimen other  than nasopharyngeal swab, presence of viral mutation(s) within the  areas targeted by this assay, and inadequate number of viral copies  (<250 copies / mL). A negative result must be combined with clinical  observations, patient history, and epidemiological information. If result is POSITIVE SARS-CoV-2 target nucleic acids are DETECTED. The SARS-CoV-2 RNA is generally detectable in upper and lower  respiratory specimens dur ing the acute  phase of infection.  Positive  results are indicative of active infection with SARS-CoV-2.  Clinical  correlation with patient history and other diagnostic information is  necessary to determine patient infection status.  Positive results do  not rule out bacterial infection or co-infection with other viruses. If result is PRESUMPTIVE POSTIVE SARS-CoV-2 nucleic acids MAY BE PRESENT.   A presumptive positive result was obtained on the submitted specimen  and confirmed on repeat testing.  While 2019 novel coronavirus  (SARS-CoV-2) nucleic acids may be present in the submitted sample  additional confirmatory testing may be necessary for epidemiological  and / or clinical management purposes  to differentiate between  SARS-CoV-2 and other Sarbecovirus currently known to infect humans.  If clinically indicated additional testing with an alternate test  methodology [phone]([id number])  is advised. The SARS-CoV-2 RNA is generally  detectable in upper and lower respiratory sp ecimens during the acute  phase of infection. The expected result is Negative. Fact Sheet for Patients:  StrictlyIdeas.no Fact Sheet for Healthcare Providers: BankingDealers.co.za This test is not yet approved or cleared by the [place] FDA and has been authorized for detection and/or diagnosis of SARS-CoV-2 by FDA under an Emergency Use Authorization (EUA).  This EUA will remain in effect (meaning this test can be used) for the duration of the COVID-19 declaration under Section 564(b)(1) of the Act, 21 U.S.C. section 360bbb-3(b)(1), unless the authorization is terminated or revoked sooner. Performed at [facility], [place] [address], [place], [place] [address]     Blood Alcohol level:  Lab Results  Component Value Date   ETH <10 61/60/7371    Metabolic Disorder Labs:  No results found for: HGBA1C, MPG No results found for: PROLACTIN No results found for: CHOL, TRIG,  HDL, CHOLHDL, VLDL, LDLCALC  Current Medications: Current Facility-Administered Medications  Medication Dose Route Frequency Provider Last Rate Last Dose  . acetaminophen (TYLENOL) tablet 650 mg  650 mg Oral Q6H PRN Nwoko, Agnes I, NP      . alum & mag hydroxide-simeth (MAALOX/MYLANTA) 200-200-20 MG/5ML suspension 30 mL  30 mL Oral Q4H PRN Nwoko, Agnes I, NP      . hydrOXYzine (ATARAX/VISTARIL) tablet 25 mg  25 mg Oral Q6H PRN Nwoko, Agnes I, NP      . magnesium hydroxide (MILK OF MAGNESIA) suspension 30 mL  30 mL Oral Daily PRN Nwoko, Agnes I, NP      . nicotine (NICODERM CQ - dosed in mg/24 hours) patch 21 mg  21 mg Transdermal Daily Berle Fitz, Myer Peer, MD   21 mg at 03/02/19 1548  . traZODone (DESYREL) tablet 50 mg  50 mg Oral QHS PRN Lindell Spar I, NP       PTA Medications: Medications Prior to Admission  Medication Sig Dispense Refill Last Dose  . acetaminophen (TYLENOL) 500 MG tablet Take 1,000 mg by mouth 2 (two) times daily as needed (For pain.).      Marland Kitchen benzonatate (TESSALON) 100 MG capsule Take 1 capsule (100 mg total) by mouth every 8 (eight) hours as needed for cough. (Patient not taking: Reported on 03/02/2019) 21 capsule 0     Musculoskeletal: Strength & Muscle Tone: within normal limits Gait & Station: normal Patient leans: N/A  Psychiatric Specialty Exam: Physical Exam  Review of Systems  Constitutional: Negative for chills and fever.  HENT: Negative.   Eyes: Negative.   Respiratory: Negative for cough and shortness of breath.   Cardiovascular: Negative for chest pain.  Gastrointestinal: Negative.  Negative for diarrhea, nausea and vomiting.  Genitourinary: Negative.   Musculoskeletal: Negative.   Skin: Negative for rash.  Neurological: Positive for headaches. Negative for seizures.  Endo/Heme/Allergies: Negative.   Psychiatric/Behavioral: Positive for depression. The patient is nervous/anxious.     Last menstrual period 02/22/2019, currently breastfeeding.There  is no height or weight on file to calculate BMI.  General Appearance: Fairly Groomed  Eye Contact:  Fair  Speech:  Normal Rate  Volume:  Decreased  Mood:  Depressed  Affect:  Constricted and intermittently tearful  Thought Process:  Linear and Descriptions of Associations: Intact  Orientation:  Full (Time, Place, and Person)  Thought Content:  no hallucinations, no delusions, not internally preoccupied   Suicidal Thoughts:  No currently denies suicidal ideations, denies plan or intention, and identifies family/daughter  as protective factor, contracts for safety on unit, denies homicidal or violent ideations  Homicidal Thoughts:  No  Memory:  recent and remote grossly intact   Judgement:  Fair  Insight:  Fair  Psychomotor Activity:  Decreased  Concentration:  Concentration: Good and Attention Span: Good  Recall:  Good  Fund of Knowledge:  Good  Language:  Good  Akathisia:  Negative  Handed:  Right  AIMS (if indicated):     Assets:  Communication Skills Desire for Improvement Resilience  ADL's:  Intact  Cognition:  WNL  Sleep:       Treatment Plan Summary: Daily contact with patient to assess and evaluate symptoms and progress in treatment, Medication management, Plan inpatient treatment and medications as below  Observation Level/Precautions:  15 minute checks  Laboratory:  as needed TSH  Psychotherapy: milieu, group therapy    Medications:  We discussed options- patient agrees to Effexor XR and Remeron for management of depression. Has not been on either of these in the past. Side effects reviewed. Start Effexor XR 37.5 mgrs QAM and Remeron 7.5 mgrs QHS  Ativan PRN for anxiety as needed   Consultations:  As needed   Discharge Concerns:  -   Estimated LOS: 4-5 days   Other:     Physician Treatment Plan for Primary Diagnosis: MDD, no psychotic features Long Term Goal(s): Improvement in symptoms so as ready for discharge  Short Term Goals: Ability to identify changes in  lifestyle to reduce recurrence of condition will improve, Ability to verbalize feelings will improve, Ability to disclose and discuss suicidal ideas, Ability to demonstrate self-control will improve, Ability to identify and develop effective coping behaviors will improve and Ability to maintain clinical measurements within normal limits will improve  Physician Treatment Plan for Secondary Diagnosis: MDD, no psychotic features  Long Term Goal(s): Improvement in symptoms so as ready for discharge  Short Term Goals: Ability to identify changes in lifestyle to reduce recurrence of condition will improve, Ability to verbalize feelings will improve, Ability to disclose and discuss suicidal ideas, Ability to demonstrate self-control will improve, Ability to identify and develop effective coping behaviors will improve and Ability to maintain clinical measurements within normal limits will improve  I certify that inpatient services furnished can reasonably be expected to improve the patient's condition.    Craige Cotta, MD 8/20/20204:36 PM

## 2019-03-02 NOTE — Tx Team (Signed)
Initial Treatment Plan 03/02/2019 3:29 PM Dana [name] [id number]    PATIENT STRESSORS: Financial difficulties Marital or family conflict Occupational concerns Traumatic event   PATIENT STRENGTHS: Ability for insight Average or above average intelligence Capable of independent living Communication skills General fund of knowledge Motivation for treatment/growth Physical Health Supportive family/friends Work skills   PATIENT IDENTIFIED PROBLEMS: "depression"  "anxiety"  "suicidal thoughts"                 DISCHARGE CRITERIA:  Ability to meet basic life and health needs Adequate post-discharge living arrangements Improved stabilization in mood, thinking, and/or behavior Motivation to continue treatment in a less acute level of care  PRELIMINARY DISCHARGE PLAN: Attend PHP/IOP Outpatient therapy Participate in family therapy Return to previous living arrangement  PATIENT/FAMILY INVOLVEMENT: This treatment plan has been presented to and reviewed with the patient, Dana [name].  The patient and family have been given the opportunity to ask questions and make suggestions.  Baron Sane, RN 03/02/2019, 3:29 PM

## 2019-03-02 NOTE — Progress Notes (Addendum)
D:  Pt states that she is anxious and increasingly depressed about being homeless with her husband and 41 yr old daughter.  She states that they are currently living with her sister in law.  Pt expresses hopelessness at times due to her situation but is able to contract for safety.  She denies any physical complaints at this time.  A:  Support, education, and encouragement provided as appropriate to situation.  Level 3 checks continued for safety.  R:  Pt receptive to measures; Safety maintained.

## 2019-03-03 LAB — LIPID PANEL
Cholesterol: 223 mg/dL — ABNORMAL HIGH (ref 0–200)
HDL: 73 mg/dL (ref >40)
LDL Cholesterol: 129 mg/dL — ABNORMAL HIGH (ref 0–99)
Total CHOL/HDL Ratio: 3.1 RATIO
Triglycerides: 104 mg/dL (ref <150)
VLDL: 21 mg/dL (ref 0–40)

## 2019-03-03 LAB — TSH: TSH: 1.161 u[IU]/mL (ref 0.350–4.500)

## 2019-03-03 MED ORDER — VENLAFAXINE HCL ER 75 MG PO CP24
75.0000 mg | ORAL_CAPSULE | Freq: Every day | ORAL | Status: DC
  Administered 2019-03-04 – 2019-03-07 (×4): 75 mg via ORAL
  Filled 2019-03-03 (×2): qty 1
  Filled 2019-03-03: qty 7
  Filled 2019-03-03 (×3): qty 1

## 2019-03-03 NOTE — Progress Notes (Signed)
Recreation Therapy Notes  Date:  8.21.20 Time: 0930 Location: [address] Dayroom  Group Topic: Stress Management  Goal Area(s) Addresses:  Patient will identify positive stress management techniques. Patient will identify benefits of using stress management post d/c.  Intervention:  Stress Management  Activity : Guided Imagery.  LRT introduced the stress management technique of guided imagery.  LRT read a script that took patients on a journey to enjoy the peaceful waves at the beach.  Education:  Stress Management, Discharge Planning.   Education Outcome: Acknowledges Education  Clinical Observations/Feedback: Pt did not attend group.     Victorino Sparrow, LRT/CTRS         Victorino Sparrow A 03/03/2019 10:27 AM

## 2019-03-03 NOTE — Progress Notes (Signed)
[facility] MD Progress Note  03/03/2019 10:40 AM Dana [name]  MRN:  [id number] Subjective: Patient reports ongoing depression and some anxiety.  Denies suicidal ideations.  Currently denies medication side effects.  Describes fair sleep last night. Objective: Have discussed case with treatment team and have met with patient.  43 year old female, presents for worsening depression, neuro-vegetative symptoms, feeling overwhelmed, passive SI. No psychotic symptoms. Facing significant stressors, including financial and unstable housing . History of chronic depression, has not been on psychiatric medications x several years.  Currently patient presents alert, attentive, depressed, with a constricted and vaguely anxious affect.  She does however smile briefly at times and today is not tearful.  She remains ruminative about stressors, mainly unstable housing.  Today denies suicidal ideations and contracts for safety on unit. She was started on Effexor XR and Remeron, denies side effects thus far. Limited milieu participation thus far, no disruptive or agitated behaviors, polite on approach. TSH 1.16, lipid panel-cholesterol 223  Principal Problem:  MDD Diagnosis: Active Problems:   Major depressive disorder, recurrent episode ([place])  Total Time spent with patient: 20 minutes  Past Psychiatric History:   Past Medical History:  Past Medical History:  Diagnosis Date  . Depression    on meds- doing well  . GERD (gastroesophageal reflux disease)    ocasional with pregnancy only  . Gestational diabetes    History with previous pregnancy 2009  . High-risk pregnancy   . Infection    UTI  . Preeclampsia    History only with first preg 2006  . Pregnancy induced hypertension    first preg  . Preterm labor    1st delivery    Past Surgical History:  Procedure Laterality Date  . CESAREAN SECTION    . CESAREAN SECTION  05/16/2012   Procedure: CESAREAN SECTION;  Surgeon: Cheri Fowler, MD;   Location: [facility];  Service: Obstetrics;  Laterality: N/A;  . CESAREAN SECTION N/A 11/13/2013   Procedure: CESAREAN SECTION;  Surgeon: Cheri Fowler, MD;  Location: [facility];  Service: Obstetrics;  Laterality: N/A;  1 1/2hrs OR time  . DILATION AND CURETTAGE OF UTERUS    . THERAPEUTIC ABORTION    . WISDOM TOOTH EXTRACTION     Family History:  Family History  Problem Relation Age of Onset  . Heart disease Father   . Heart disease Paternal Grandfather    Family Psychiatric  History:  Social History:  Social History   Substance and Sexual Activity  Alcohol Use No     Social History   Substance and Sexual Activity  Drug Use No    Social History   Socioeconomic History  . Marital status: Divorced    Spouse name: Not on file  . Number of children: Not on file  . Years of education: Not on file  . Highest education level: Not on file  Occupational History  . Not on file  Social Needs  . Financial resource strain: Not on file  . Food insecurity    Worry: Not on file    Inability: Not on file  . Transportation needs    Medical: Not on file    Non-medical: Not on file  Tobacco Use  . Smoking status: Current Every Day Smoker    Packs/day: 0.50    Years: 15.00    Pack years: 7.50    Types: Cigarettes    Last attempt to quit: 03/13/2013    Years since quitting: 5.9  . Smokeless tobacco: Never  Used  Substance and Sexual Activity  . Alcohol use: No  . Drug use: No  . Sexual activity: Yes    Birth control/protection: None    Comment: pregnant  Lifestyle  . Physical activity    Days per week: Not on file    Minutes per session: Not on file  . Stress: Not on file  Relationships  . Social Herbalist on phone: Not on file    Gets together: Not on file    Attends religious service: Not on file    Active member of club or organization: Not on file    Attends meetings of clubs or organizations: Not on file    Relationship status: Not on file  Other Topics Concern   . Not on file  Social History Narrative  . Not on file   Additional Social History:   Sleep: Fair  Appetite:  Fair  Current Medications: Current Facility-Administered Medications  Medication Dose Route Frequency Provider Last Rate Last Dose  . acetaminophen (TYLENOL) tablet 650 mg  650 mg Oral Q6H PRN Nwoko, Herbert Pun I, NP      . alum & mag hydroxide-simeth (MAALOX/MYLANTA) 200-200-20 MG/5ML suspension 30 mL  30 mL Oral Q4H PRN Nwoko, Agnes I, NP      . LORazepam (ATIVAN) tablet 0.5 mg  0.5 mg Oral Q6H PRN Cobos, Myer Peer, MD      . magnesium hydroxide (MILK OF MAGNESIA) suspension 30 mL  30 mL Oral Daily PRN Nwoko, Agnes I, NP      . mirtazapine (REMERON) tablet 7.5 mg  7.5 mg Oral QHS Cobos, Fernando A, MD   7.5 mg at 03/02/19 2210  . nicotine (NICODERM CQ - dosed in mg/24 hours) patch 21 mg  21 mg Transdermal Daily Cobos, Myer Peer, MD   21 mg at 03/03/19 3559  . venlafaxine XR (EFFEXOR-XR) 24 hr capsule 37.5 mg  37.5 mg Oral Q breakfast Cobos, Myer Peer, MD   37.5 mg at 03/03/19 7416    Lab Results:  Results for orders placed or performed during the hospital encounter of 03/02/19 (from the past 48 hour(s))  Lipid panel     Status: Abnormal   Collection Time: 03/03/19  6:57 AM  Result Value Ref Range   Cholesterol 223 (H) 0 - 200 mg/dL   Triglycerides 104 <150 mg/dL   HDL 73 >40 mg/dL   Total CHOL/HDL Ratio 3.1 RATIO   VLDL 21 0 - 40 mg/dL   LDL Cholesterol 129 (H) 0 - 99 mg/dL    Comment:        Total Cholesterol/HDL:CHD Risk Coronary Heart Disease Risk Table                     Men   Women  1/2 Average Risk   3.4   3.3  Average Risk       5.0   4.4  2 X Average Risk   9.6   7.1  3 X Average Risk  23.4   11.0        Use the calculated Patient Ratio above and the CHD Risk Table to determine the patient's CHD Risk.        ATP III CLASSIFICATION (LDL):  <100     mg/dL   Optimal  100-129  mg/dL   Near or Above                    Optimal  130-159  mg/dL    Borderline  160-189  mg/dL   High  >190     mg/dL   Very High Performed at [facility] [address], [place], [place] [address]   TSH     Status: None   Collection Time: 03/03/19  6:57 AM  Result Value Ref Range   TSH 1.161 0.350 - 4.500 uIU/mL    Comment: Performed by a 3rd Generation assay with a functional sensitivity of <=0.01 uIU/mL. Performed at [facility], [place] [address] [place], [place] [address]     Blood Alcohol level:  Lab Results  Component Value Date   ETH <10 76/22/6333    Metabolic Disorder Labs: No results found for: HGBA1C, MPG No results found for: PROLACTIN Lab Results  Component Value Date   CHOL 223 (H) 03/03/2019   TRIG 104 03/03/2019   HDL 73 03/03/2019   CHOLHDL 3.1 03/03/2019   VLDL 21 03/03/2019   LDLCALC 129 (H) 03/03/2019    Physical Findings: AIMS: Facial and Oral Movements Muscles of Facial Expression: None, normal Lips and Perioral Area: None, normal Jaw: None, normal Tongue: None, normal,Extremity Movements Upper (arms, wrists, hands, fingers): None, normal Lower (legs, knees, ankles, toes): None, normal, Trunk Movements Neck, shoulders, hips: None, normal, Overall Severity Severity of abnormal movements (highest score from questions above): None, normal Incapacitation due to abnormal movements: None, normal Patient's awareness of abnormal movements (rate only patient's report): No Awareness, Dental Status Current problems with teeth and/or dentures?: No Does patient usually wear dentures?: No  CIWA:    COWS:     Musculoskeletal: Strength & Muscle Tone: within normal limits Gait & Station: normal Patient leans: N/A  Psychiatric Specialty Exam: Physical Exam  ROS denies headache or chest pain, no shortness of breath, no cough, no fever, no chills  Blood pressure 116/79, pulse 91, temperature 98 F (36.7 C), temperature source Oral, last menstrual period 02/22/2019, currently  breastfeeding.There is no height or weight on file to calculate BMI.  General Appearance: Fairly Groomed  Eye Contact:  Good  Speech:  Normal Rate  Volume:  Decreased  Mood:  Depressed  Affect:  Remains constricted but not tearful today  Thought Process:  Linear and Descriptions of Associations: Intact  Orientation:  Full (Time, Place, and Person)  Thought Content:  No hallucinations, no delusions  Suicidal Thoughts:  No currently denies suicidal ideations and contracts for safety on unit  Homicidal Thoughts:  No  Memory:  Recent and remote grossly intact  Judgement:  Fair  Insight:  Fair  Psychomotor Activity:  Decreased  Concentration:  Concentration: Good and Attention Span: Good  Recall:  Good  Fund of Knowledge:  Good  Language:  Good  Akathisia:  Negative  Handed:  Right  AIMS (if indicated):     Assets:  Communication Skills Desire for Improvement Resilience  ADL's:  Intact  Cognition:  WNL  Sleep:      Assessment: 43 year old female, presents for worsening depression, neuro-vegetative symptoms, feeling overwhelmed, passive SI. No psychotic symptoms. Facing significant stressors, including financial and unstable housing . History of chronic depression, has not been on psychiatric medications x several years.   At this time patient presents depressed, with a constricted but somewhat improved affect today.  Not tearful today.  Denies suicidal ideations.  Thus far tolerating Effexor XR/Remeron trial well. Treatment Plan Summary: Daily contact with patient to assess and evaluate symptoms and progress in treatment, Medication management, Plan Inpatient treatment and  Medication as below Encourage group and milieu participation Continue Remeron 7.5 mg nightly for insomnia and depression Increase Effexor XR to 75 mg daily for depression Continue Ativan 0.5 mg every 6 hours PRN for anxiety Continue NicoDerm/nicotine patch for nicotine cravings/withdrawal Treatment team working  on disposition planning options Jenne Campus, MD 03/03/2019, 10:40 AM
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
## 2019-03-03 NOTE — BHH Counselor (Signed)
CSW attempted to meet with the patient to complete assessment, however the patient refused to meet at this time. Patient states "I do not feel like meeting".   CSW will attempt to complete assessment at a later time.     Radonna Ricker, MSW, [place] Worker [facility]  Phone: [phone]

## 2019-03-03 NOTE — BHH Group Notes (Signed)
03/03/2019 8:45am Type of Group and Topic: Psychoeducational Group: Discharge Planning Participation Level: Minimal  Description of Group Discharge planning group reviews patient's anticipated discharge plans and assists patients to anticipate and address any barriers to wellness/recovery in the community. Suicide prevention education is reviewed with patients in group. Therapeutic Goals 1. Patients will state their anticipated discharge plan and mental health aftercare 2. Patients will identify potential barriers to wellness in the community setting 3. Patients will engage in problem solving, solution focused discussion of ways to anticipate and address barriers to wellness/recovery   Summary of Patient Progress  Plan for Discharge/Comments: Dana [name] reports she does not know what her discharge plan is at this time.   Transportation Means: To be determined   Supports: Patient did not disclose    Therapeutic Modalities: Motivational Interviewing     Dana [name], MSW, [place] Worker [facility]  Phone: [phone] 03/03/2019 2:08 PM

## 2019-03-03 NOTE — BHH Suicide Risk Assessment (Signed)
[facility] INPATIENT:  Family/Significant Other Suicide Prevention Education  Suicide Prevention Education:  Patient Refusal for Family/Significant Other Suicide Prevention Education: The patient Dana [name] has refused to provide written consent for family/significant other to be provided Family/Significant Other Suicide Prevention Education during admission and/or prior to discharge.  Physician notified.    [name] 03/03/2019, 2:10 PM

## 2019-03-03 NOTE — Progress Notes (Signed)
Patient, her husband, and their minor child are experiencing homelessness/unstable housing.  CSW provided patient with the contact information for the [facility] in [place], as they are able to offer shelter (when beds are available) to families.  CSW also provided patient with the contact information for [facility] and advised patient to call and complete a phone assessment.  Stephanie Acre, LCSW-A Clinical Social Worker

## 2019-03-03 NOTE — BHH Group Notes (Signed)
[facility] Group Notes:  (Nursing/MHT/Case Management/Adjunct)  Date:  03/03/2019  Time:  10:00 AM  Type of Therapy:  Nurse Education  Participation Level:  Did Not Attend  Baron Sane 03/03/2019, 11:29 AM

## 2019-03-03 NOTE — Tx Team (Signed)
Interdisciplinary Treatment and Diagnostic Plan Update  03/03/2019 Time of Session: 9:00am Dana [name] MRN: [id number]  Principal Diagnosis: <principal problem not specified>  Secondary Diagnoses: Active Problems:   Major depressive disorder, recurrent episode ([place])   Current Medications:  Current Facility-Administered Medications  Medication Dose Route Frequency Provider Last Rate Last Dose  . acetaminophen (TYLENOL) tablet 650 mg  650 mg Oral Q6H PRN Nwoko, Herbert Pun I, NP      . alum & mag hydroxide-simeth (MAALOX/MYLANTA) 200-200-20 MG/5ML suspension 30 mL  30 mL Oral Q4H PRN Nwoko, Agnes I, NP      . LORazepam (ATIVAN) tablet 0.5 mg  0.5 mg Oral Q6H PRN Cobos, Myer Peer, MD      . magnesium hydroxide (MILK OF MAGNESIA) suspension 30 mL  30 mL Oral Daily PRN Nwoko, Agnes I, NP      . mirtazapine (REMERON) tablet 7.5 mg  7.5 mg Oral QHS Cobos, Fernando A, MD   7.5 mg at 03/02/19 2210  . nicotine (NICODERM CQ - dosed in mg/24 hours) patch 21 mg  21 mg Transdermal Daily Cobos, Myer Peer, MD   21 mg at 03/03/19 8502  . venlafaxine XR (EFFEXOR-XR) 24 hr capsule 37.5 mg  37.5 mg Oral Q breakfast Cobos, Myer Peer, MD   37.5 mg at 03/03/19 7741   PTA Medications: Medications Prior to Admission  Medication Sig Dispense Refill Last Dose  . acetaminophen (TYLENOL) 500 MG tablet Take 1,000 mg by mouth 2 (two) times daily as needed (For pain.).      Marland Kitchen benzonatate (TESSALON) 100 MG capsule Take 1 capsule (100 mg total) by mouth every 8 (eight) hours as needed for cough. (Patient not taking: Reported on 03/02/2019) 21 capsule 0     Patient Stressors: Financial difficulties Marital or family conflict Occupational concerns Traumatic event  Patient Strengths: Ability for insight Average or above average intelligence Capable of independent living Communication skills General fund of knowledge Motivation for treatment/growth Physical Health Supportive family/friends Work  skills  Treatment Modalities: Medication Management, Group therapy, Case management,  1 to 1 session with clinician, Psychoeducation, Recreational therapy.   Physician Treatment Plan for Primary Diagnosis: <principal problem not specified> Long Term Goal(s): Improvement in symptoms so as ready for discharge Improvement in symptoms so as ready for discharge   Short Term Goals: Ability to identify changes in lifestyle to reduce recurrence of condition will improve Ability to verbalize feelings will improve Ability to disclose and discuss suicidal ideas Ability to demonstrate self-control will improve Ability to identify and develop effective coping behaviors will improve Ability to maintain clinical measurements within normal limits will improve Ability to identify changes in lifestyle to reduce recurrence of condition will improve Ability to verbalize feelings will improve Ability to disclose and discuss suicidal ideas Ability to demonstrate self-control will improve Ability to identify and develop effective coping behaviors will improve Ability to maintain clinical measurements within normal limits will improve  Medication Management: Evaluate patient's response, side effects, and tolerance of medication regimen.  Therapeutic Interventions: 1 to 1 sessions, Unit Group sessions and Medication administration.  Evaluation of Outcomes: Not Met  Physician Treatment Plan for Secondary Diagnosis: Active Problems:   Major depressive disorder, recurrent episode ([place])  Long Term Goal(s): Improvement in symptoms so as ready for discharge Improvement in symptoms so as ready for discharge   Short Term Goals: Ability to identify changes in lifestyle to reduce recurrence of condition will improve Ability to verbalize feelings will improve Ability to disclose and  discuss suicidal ideas Ability to demonstrate self-control will improve Ability to identify and develop effective coping behaviors  will improve Ability to maintain clinical measurements within normal limits will improve Ability to identify changes in lifestyle to reduce recurrence of condition will improve Ability to verbalize feelings will improve Ability to disclose and discuss suicidal ideas Ability to demonstrate self-control will improve Ability to identify and develop effective coping behaviors will improve Ability to maintain clinical measurements within normal limits will improve     Medication Management: Evaluate patient's response, side effects, and tolerance of medication regimen.  Therapeutic Interventions: 1 to 1 sessions, Unit Group sessions and Medication administration.  Evaluation of Outcomes: Not Met   RN Treatment Plan for Primary Diagnosis: <principal problem not specified> Long Term Goal(s): Knowledge of disease and therapeutic regimen to maintain health will improve  Short Term Goals: Ability to verbalize feelings will improve, Ability to identify and develop effective coping behaviors will improve and Compliance with prescribed medications will improve  Medication Management: RN will administer medications as ordered by provider, will assess and evaluate patient's response and provide education to patient for prescribed medication. RN will report any adverse and/or side effects to prescribing provider.  Therapeutic Interventions: 1 on 1 counseling sessions, Psychoeducation, Medication administration, Evaluate responses to treatment, Monitor vital signs and CBGs as ordered, Perform/monitor CIWA, COWS, AIMS and Fall Risk screenings as ordered, Perform wound care treatments as ordered.  Evaluation of Outcomes: Not Met   LCSW Treatment Plan for Primary Diagnosis: <principal problem not specified> Long Term Goal(s): Safe transition to appropriate next level of care at discharge, Engage patient in therapeutic group addressing interpersonal concerns.  Short Term Goals: Engage patient in aftercare  planning with referrals and resources, Increase social support, Increase emotional regulation, Identify triggers associated with mental health/substance abuse issues and Increase skills for wellness and recovery  Therapeutic Interventions: Assess for all discharge needs, 1 to 1 time with Social worker, Explore available resources and support systems, Assess for adequacy in community support network, Educate family and significant other(s) on suicide prevention, Complete Psychosocial Assessment, Interpersonal group therapy.  Evaluation of Outcomes: Not Met   Progress in Treatment: Attending groups: No. Participating in groups: No. Taking medication as prescribed: No. Toleration medication: No. Family/Significant other contact made: No, will contact:  supports if consents are granted. Patient understands diagnosis: Yes. Discussing patient identified problems/goals with staff: No. Medical problems stabilized or resolved: Yes. Denies suicidal/homicidal ideation: No. Issues/concerns per patient self-inventory: Yes.  New problem(s) identified: Yes, Describe:  homelessness, financial stressors, family stressors  New Short Term/Long Term Goal(s): detox, medication management for mood stabilization; elimination of SI thoughts; development of comprehensive mental wellness/sobriety plan.  Patient Goals:  Did not want to meet with CSW to identify a goal.  Discharge Plan or Barriers: CSW assessing for appropriate referrals.   Reason for Continuation of Hospitalization: Anxiety Depression Suicidal ideation  Estimated Length of Stay: 3-5 days  Attendees: Patient: 03/03/2019 9:48 AM  Physician: Larene Beach 03/03/2019 9:48 AM  Nursing: Benjamine Mola, RN 03/03/2019 9:48 AM  RN Care Manager: 03/03/2019 9:48 AM  Social Worker: Stephanie Acre, [employer] 03/03/2019 9:48 AM  Recreational Therapist:  03/03/2019 9:48 AM  Other:  03/03/2019 9:48 AM  Other:  03/03/2019 9:48 AM  Other: 03/03/2019 9:48 AM    Scribe for  Treatment Team: [name], [place] 03/03/2019 9:48 AM

## 2019-03-03 NOTE — Progress Notes (Signed)
DAR NOTE: Patient presents with flat affect and depressed mood.  Denies suicidal thoughts, auditory and visual hallucinations.  Described energy level as low and concentration as poor.  Rates depression at 3, hopelessness at 3, and anxiety at 7.  Maintained on routine safety checks.  Medications given as prescribed.  Support and encouragement offered as needed.  States goal for today is "sleep."  Patient is withdrawn and isolates to her room.  Patient is safe on and off the unit.

## 2019-03-04 LAB — PROLACTIN: Prolactin: 18.7 ng/mL (ref 4.8–23.3)

## 2019-03-04 NOTE — [facility] Group Notes (Signed)
LCSW Group Therapy Note  Date and Time: 03/04/2019 @ 10:00am  Type of Therapy and Topic: Group Therapy: Feelings Around Returning Home & Establishing a Supportive Framework and Supporting Oneself When Supports Not Available  Participation Level: [facility] PARTICIPATION LEVEL: Did Not Attend  Mood: Did not attend     Description of Group:  Patients first processed thoughts and feelings about upcoming discharge. These included fears of upcoming changes, lack of change, new living environments, judgements and expectations from others and overall stigma of mental health issues. The group then discussed the definition of a supportive framework, what that looks and feels like, and how do to discern it from an unhealthy non-supportive network. The group identified different types of supports as well as what to do when your family/friends are less than helpful or unavailable     Therapeutic Goals   1.  Patient will identify one healthy supportive network that they can use at discharge.  2.  Patient will identify one factor of a supportive framework and how to tell it from an unhealthy network.  3.  Patient able to identify one coping skill to use when they do not have positive supports from others.  4.  Patient will demonstrate ability to communicate their needs through discussion and/or role plays.     Summary of Patient Progress:       Patient did not attend group today.      Therapeutic Modalities  Cognitive Behavioral Therapy  Motivational Interviewing   Ardelle Anton, MSW, [place]

## 2019-03-04 NOTE — Progress Notes (Signed)
[facility] NOVEL CORONAVIRUS (COVID-19) DAILY CHECK-OFF SYMPTOMS - answer yes or no to each - every day NO YES  Have you had a fever in the past 24 hours?  . Fever (Temp > 37.80C / 100F) X   Have you had any of these symptoms in the past 24 hours? . New Cough .  Sore Throat  .  Shortness of Breath .  Difficulty Breathing .  Unexplained Body Aches   X   Have you had any one of these symptoms in the past 24 hours not related to allergies?   . Runny Nose .  Nasal Congestion .  Sneezing   X   If you have had runny nose, nasal congestion, sneezing in the past 24 hours, has it worsened?  X   EXPOSURES - check yes or no X   Have you traveled outside the state in the past 14 days?  X   Have you been in contact with someone with a confirmed diagnosis of COVID-19 or PUI in the past 14 days without wearing appropriate PPE?  X   Have you been living in the same home as a person with confirmed diagnosis of COVID-19 or a PUI (household contact)?    X   Have you been diagnosed with COVID-19?    X              What to do next: Answered NO to all: Answered YES to anything:   Proceed with unit schedule Follow the [facility] Inpatient Flowsheet.   

## 2019-03-04 NOTE — Progress Notes (Signed)
D. Pt presents with a flat affect- brightens a little during interactions- per pt's self inventory, pt rated her depression, hopelessness and anxiety a 6/6/5, respectively. Pt writes that the most important goal for her to work on today is "finding resources".Pt currently denies SI/HI and AVH A. Labs and vitals monitored. Pt compliant with medications. Pt supported emotionally and encouraged to express concerns and ask questions.   R. Pt remains safe with 15 minute checks. Will continue POC.

## 2019-03-04 NOTE — Progress Notes (Signed)
[facility] MD Progress Note  03/04/2019 12:13 PM Dana [name]  MRN:  [id number] Subjective: Patient reports she is feeling "a little better".  States she slept better yesterday.  Currently denies suicidal ideations.  Denies medication side effects. Objective: Have discussed case with treatment team and have met with patient.  42 year old female, presents for worsening depression, neuro-vegetative symptoms, feeling overwhelmed, passive SI. No psychotic symptoms. Facing significant stressors, including financial and unstable housing . History of chronic depression, has not been on psychiatric medications x several years.  Remains depressed with a constricted affect.  There has been some improvement compared to admission and she does present with improved eye contact and improved rate of speech.  She denies suicidal ideations. She remains ruminative about stressors, mainly financial and unstable housing.  She states however that she is pleased that her daughter was recently enrolled in school, as not knowing where she was going go to school was another significant concern for patient.  Staff notes indicate that patient remains depressed with a flat affect although brightens partially during interactions. Limited milieu/group participation Thus far tolerating medications well, currently on Remeron and Effexor XR Principal Problem:  MDD Diagnosis: Active Problems:   Major depressive disorder, recurrent episode ([place])  Total Time spent with patient: 20 minutes  Past Psychiatric History:   Past Medical History:  Past Medical History:  Diagnosis Date  . Depression    on meds- doing well  . GERD (gastroesophageal reflux disease)    ocasional with pregnancy only  . Gestational diabetes    History with previous pregnancy 2009  . High-risk pregnancy   . Infection    UTI  . Preeclampsia    History only with first preg 2006  . Pregnancy induced hypertension    first preg  . Preterm labor    1st  delivery    Past Surgical History:  Procedure Laterality Date  . CESAREAN SECTION    . CESAREAN SECTION  05/16/2012   Procedure: CESAREAN SECTION;  Surgeon: Cheri Fowler, MD;  Location: [facility];  Service: Obstetrics;  Laterality: N/A;  . CESAREAN SECTION N/A 11/13/2013   Procedure: CESAREAN SECTION;  Surgeon: Cheri Fowler, MD;  Location: [facility];  Service: Obstetrics;  Laterality: N/A;  1 1/2hrs OR time  . DILATION AND CURETTAGE OF UTERUS    . THERAPEUTIC ABORTION    . WISDOM TOOTH EXTRACTION     Family History:  Family History  Problem Relation Age of Onset  . Heart disease Father   . Heart disease Paternal Grandfather    Family Psychiatric  History:  Social History:  Social History   Substance and Sexual Activity  Alcohol Use No     Social History   Substance and Sexual Activity  Drug Use No    Social History   Socioeconomic History  . Marital status: Divorced    Spouse name: Not on file  . Number of children: Not on file  . Years of education: Not on file  . Highest education level: Not on file  Occupational History  . Not on file  Social Needs  . Financial resource strain: Not on file  . Food insecurity    Worry: Not on file    Inability: Not on file  . Transportation needs    Medical: Not on file    Non-medical: Not on file  Tobacco Use  . Smoking status: Current Every Day Smoker    Packs/day: 0.50    Years: 15.00  Pack years: 7.50    Types: Cigarettes    Last attempt to quit: 03/13/2013    Years since quitting: 5.9  . Smokeless tobacco: Never Used  Substance and Sexual Activity  . Alcohol use: No  . Drug use: No  . Sexual activity: Yes    Birth control/protection: None    Comment: pregnant  Lifestyle  . Physical activity    Days per week: Not on file    Minutes per session: Not on file  . Stress: Not on file  Relationships  . Social Herbalist on phone: Not on file    Gets together: Not on file    Attends religious service: Not  on file    Active member of club or organization: Not on file    Attends meetings of clubs or organizations: Not on file    Relationship status: Not on file  Other Topics Concern  . Not on file  Social History Narrative  . Not on file   Additional Social History:   Sleep: Improving  Appetite:  Fair/improving  Current Medications: Current Facility-Administered Medications  Medication Dose Route Frequency Provider Last Rate Last Dose  . acetaminophen (TYLENOL) tablet 650 mg  650 mg Oral Q6H PRN Nwoko, Herbert Pun I, NP      . alum & mag hydroxide-simeth (MAALOX/MYLANTA) 200-200-20 MG/5ML suspension 30 mL  30 mL Oral Q4H PRN Nwoko, Agnes I, NP      . LORazepam (ATIVAN) tablet 0.5 mg  0.5 mg Oral Q6H PRN Cobos, Myer Peer, MD      . magnesium hydroxide (MILK OF MAGNESIA) suspension 30 mL  30 mL Oral Daily PRN Nwoko, Agnes I, NP      . mirtazapine (REMERON) tablet 7.5 mg  7.5 mg Oral QHS Cobos, Fernando A, MD   7.5 mg at 03/03/19 2136  . nicotine (NICODERM CQ - dosed in mg/24 hours) patch 21 mg  21 mg Transdermal Daily Cobos, Myer Peer, MD   21 mg at 03/04/19 0843  . venlafaxine XR (EFFEXOR-XR) 24 hr capsule 75 mg  75 mg Oral Q breakfast Cobos, Myer Peer, MD   75 mg at 03/04/19 0825    Lab Results:  Results for orders placed or performed during the hospital encounter of 03/02/19 (from the past 48 hour(s))  Lipid panel     Status: Abnormal   Collection Time: 03/03/19  6:57 AM  Result Value Ref Range   Cholesterol 223 (H) 0 - 200 mg/dL   Triglycerides 104 <150 mg/dL   HDL 73 >40 mg/dL   Total CHOL/HDL Ratio 3.1 RATIO   VLDL 21 0 - 40 mg/dL   LDL Cholesterol 129 (H) 0 - 99 mg/dL    Comment:        Total Cholesterol/HDL:CHD Risk Coronary Heart Disease Risk Table                     Men   Women  1/2 Average Risk   3.4   3.3  Average Risk       5.0   4.4  2 X Average Risk   9.6   7.1  3 X Average Risk  23.4   11.0        Use the calculated Patient Ratio above and the CHD Risk  Table to determine the patient's CHD Risk.        ATP III CLASSIFICATION (LDL):  <100     mg/dL   Optimal  100-129  mg/dL   Near or Above                    Optimal  130-159  mg/dL   Borderline  160-189  mg/dL   High  >190     mg/dL   Very High Performed at [facility] [address] [place], [place] [address]   TSH     Status: None   Collection Time: 03/03/19  6:57 AM  Result Value Ref Range   TSH 1.161 0.350 - 4.500 uIU/mL    Comment: Performed by a 3rd Generation assay with a functional sensitivity of <=0.01 uIU/mL. Performed at [facility], [place] [address], [place], [place] [address]   Prolactin     Status: None   Collection Time: 03/03/19  6:57 AM  Result Value Ref Range   Prolactin 18.7 4.8 - 23.3 ng/mL    Comment: (NOTE) Performed At: [facility] [place], [place] [id number] Rush Farmer MD PF:[id number]     Blood Alcohol level:  Lab Results  Component Value Date   ETH <10 81/77/1165    Metabolic Disorder Labs: No results found for: HGBA1C, MPG Lab Results  Component Value Date   PROLACTIN 18.7 03/03/2019   Lab Results  Component Value Date   CHOL 223 (H) 03/03/2019   TRIG 104 03/03/2019   HDL 73 03/03/2019   CHOLHDL 3.1 03/03/2019   VLDL 21 03/03/2019   LDLCALC 129 (H) 03/03/2019    Physical Findings: AIMS: Facial and Oral Movements Muscles of Facial Expression: None, normal Lips and Perioral Area: None, normal Jaw: None, normal Tongue: None, normal,Extremity Movements Upper (arms, wrists, hands, fingers): None, normal Lower (legs, knees, ankles, toes): None, normal, Trunk Movements Neck, shoulders, hips: None, normal, Overall Severity Severity of abnormal movements (highest score from questions above): None, normal Incapacitation due to abnormal movements: None, normal Patient's awareness of abnormal movements (rate only patient's report): No Awareness, Dental Status Current  problems with teeth and/or dentures?: No Does patient usually wear dentures?: No  CIWA:    COWS:     Musculoskeletal: Strength & Muscle Tone: within normal limits Gait & Station: normal Patient leans: N/A  Psychiatric Specialty Exam: Physical Exam  ROS denies headache or chest pain, no shortness of breath, no cough, no fever, no chills  Blood pressure 108/76, pulse 98, temperature 98.2 F (36.8 C), temperature source Oral, resp. rate 18, last menstrual period 02/22/2019, currently breastfeeding.There is no height or weight on file to calculate BMI.  General Appearance: Improving grooming  Eye Contact:  Good  Speech:  Normal Rate  Volume:  Normal  Mood:  Depressed, reports feeling partially better than she did on admission  Affect:  Constricted, does smile briefly/appropriately at times during session  Thought Process:  Linear and Descriptions of Associations: Intact  Orientation:  Full (Time, Place, and Person)  Thought Content:  No hallucinations, no delusions  Suicidal Thoughts:  No currently denies suicidal ideations and contracts for safety on unit  Homicidal Thoughts:  No  Memory:  Recent and remote grossly intact  Judgement: Improving  Insight:  Fair/improving  Psychomotor Activity:  Decreased  Concentration:  Concentration: Good and Attention Span: Good  Recall:  Good  Fund of Knowledge:  Good  Language:  Good  Akathisia:  Negative  Handed:  Right  AIMS (if indicated):     Assets:  Communication Skills Desire for Improvement Resilience  ADL's:  Intact  Cognition:  WNL  Sleep:  Number of Hours: 6.75   Assessment: 43 year old female, presents for worsening depression, neuro-vegetative symptoms, feeling overwhelmed, passive SI. No psychotic symptoms. Facing significant stressors, including financial and unstable housing . History of chronic depression, has not been on psychiatric medications x several years.   Patient remains depressed but presents with partially  improved affect, and improved rate/volume of speech.  Currently denies suicidal ideations.  Tolerating Effexor XR/Remeron trial well thus far. Treatment Plan Summary: Daily contact with patient to assess and evaluate symptoms and progress in treatment, Medication management, Plan Inpatient treatment and Medication as below  Treatment plan reviewed as below today 8/22 Encourage group and milieu participation Continue Remeron 7.5 mg nightly for insomnia and depression Continue Effexor XR  75 mg daily for depression Continue Ativan 0.5 mg every 6 hours PRN for anxiety Continue NicoDerm/nicotine patch for nicotine cravings/withdrawal Treatment team working on disposition planning options Jenne Campus, MD 03/04/2019, 12:13 PM   Patient ID: Dana [name], female   DOB: [birth year], 43 y.o.   MRN: [id number]

## 2019-03-04 NOTE — Progress Notes (Signed)
Adult Psychoeducational Group Note  Date:  03/04/2019 Time:  11:53 PM  Group Topic/Focus:  Wrap-Up Group:   The focus of this group is to help patients review their daily goal of treatment and discuss progress on daily workbooks.  Participation Level:  Active  Participation Quality:  Appropriate  Affect:  Appropriate  Cognitive:  Appropriate  Insight: Appropriate  Engagement in Group:  Engaged  Modes of Intervention:  Discussion  Additional Comments:  Pt stated her goal was to get in touch with there resources she found from the Education officer, museum.  Pt did not achieve her goal as those resources were not accessible over the weekend.  Pt rated the day at a 5/10.  Dana [name] 03/04/2019, 11:53 PM

## 2019-03-04 NOTE — Progress Notes (Signed)
D.  Pt pleasant on approach, no complaints voiced at this time.  Pt was positive for evening wrap up group, observed engaged appropriately with peers on the unit.  Pt denies SI/HI/AVH at this time.  A.  Support and encouragement offered, medication given as ordered  R  Pt remains safe on the unit, will continue to monitor.   

## 2019-03-05 MED ORDER — MIRTAZAPINE 15 MG PO TABS
15.0000 mg | ORAL_TABLET | Freq: Every day | ORAL | Status: DC
  Administered 2019-03-05 – 2019-03-06 (×2): 15 mg via ORAL
  Filled 2019-03-05 (×3): qty 1
  Filled 2019-03-05: qty 7

## 2019-03-05 NOTE — Progress Notes (Signed)
[facility] MD Progress Note  03/05/2019 11:23 AM Dana [name]  MRN:  [id number] Subjective: She reports feeling better than on admission. Denies SI. Denies medication side effects.  Objective: Have discussed case with treatment team and have met with patient.  43 year old female, presents for worsening depression, neuro-vegetative symptoms, feeling overwhelmed, passive SI. No psychotic symptoms. Facing significant stressors, including financial and unstable housing . History of chronic depression, has not been on psychiatric medications x several years.  Patient presents calm, cooperative but somewhat minimal in answers. Answers most questions with monosyllables or short phrases. Does state she is feeling better and that she feels that admission to unit has been helpful. Denies suicidal ideations.  Denies medication side effects. Pleasant, calm on approach. No disruptive or agitated behaviors on unit, spending most time in her room, but attending some groups.   Principal Problem:  MDD Diagnosis: Active Problems:   Major depressive disorder, recurrent episode ([place])  Total Time spent with patient: 20 minutes  Past Psychiatric History:   Past Medical History:  Past Medical History:  Diagnosis Date  . Depression    on meds- doing well  . GERD (gastroesophageal reflux disease)    ocasional with pregnancy only  . Gestational diabetes    History with previous pregnancy 2009  . High-risk pregnancy   . Infection    UTI  . Preeclampsia    History only with first preg 2006  . Pregnancy induced hypertension    first preg  . Preterm labor    1st delivery    Past Surgical History:  Procedure Laterality Date  . CESAREAN SECTION    . CESAREAN SECTION  05/16/2012   Procedure: CESAREAN SECTION;  Surgeon: Cheri Fowler, MD;  Location: [facility];  Service: Obstetrics;  Laterality: N/A;  . CESAREAN SECTION N/A 11/13/2013   Procedure: CESAREAN SECTION;  Surgeon: Cheri Fowler, MD;  Location: [facility];  Service: Obstetrics;  Laterality: N/A;  1 1/2hrs OR time  . DILATION AND CURETTAGE OF UTERUS    . THERAPEUTIC ABORTION    . WISDOM TOOTH EXTRACTION     Family History:  Family History  Problem Relation Age of Onset  . Heart disease Father   . Heart disease Paternal Grandfather    Family Psychiatric  History:  Social History:  Social History   Substance and Sexual Activity  Alcohol Use No     Social History   Substance and Sexual Activity  Drug Use No    Social History   Socioeconomic History  . Marital status: Divorced    Spouse name: Not on file  . Number of children: Not on file  . Years of education: Not on file  . Highest education level: Not on file  Occupational History  . Not on file  Social Needs  . Financial resource strain: Not on file  . Food insecurity    Worry: Not on file    Inability: Not on file  . Transportation needs    Medical: Not on file    Non-medical: Not on file  Tobacco Use  . Smoking status: Current Every Day Smoker    Packs/day: 0.50    Years: 15.00    Pack years: 7.50    Types: Cigarettes    Last attempt to quit: 03/13/2013    Years since quitting: 5.9  . Smokeless tobacco: Never Used  Substance and Sexual Activity  . Alcohol use: No  . Drug use: No  . Sexual activity: Yes  Birth control/protection: None    Comment: pregnant  Lifestyle  . Physical activity    Days per week: Not on file    Minutes per session: Not on file  . Stress: Not on file  Relationships  . Social connections    Talks on phone: Not on file    Gets together: Not on file    Attends religious service: Not on file    Active member of club or organization: Not on file    Attends meetings of clubs or organizations: Not on file    Relationship status: Not on file  Other Topics Concern  . Not on file  Social History Narrative  . Not on file   Additional Social History:   Sleep: Improving  Appetite:  improving   Current Medications: Current  Facility-Administered Medications  Medication Dose Route Frequency Provider Last Rate Last Dose  . acetaminophen (TYLENOL) tablet 650 mg  650 mg Oral Q6H PRN Nwoko, Agnes I, NP      . alum & mag hydroxide-simeth (MAALOX/MYLANTA) 200-200-20 MG/5ML suspension 30 mL  30 mL Oral Q4H PRN Nwoko, Agnes I, NP      . LORazepam (ATIVAN) tablet 0.5 mg  0.5 mg Oral Q6H PRN Cobos, Fernando A, MD   0.5 mg at 03/04/19 2102  . magnesium hydroxide (MILK OF MAGNESIA) suspension 30 mL  30 mL Oral Daily PRN Nwoko, Agnes I, NP      . mirtazapine (REMERON) tablet 7.5 mg  7.5 mg Oral QHS Cobos, Fernando A, MD   7.5 mg at 03/04/19 2102  . nicotine (NICODERM CQ - dosed in mg/24 hours) patch 21 mg  21 mg Transdermal Daily Cobos, Fernando A, MD   21 mg at 03/05/19 0916  . venlafaxine XR (EFFEXOR-XR) 24 hr capsule 75 mg  75 mg Oral Q breakfast Cobos, Fernando A, MD   75 mg at 03/05/19 0916    Lab Results:  No results found for this or any previous visit (from the past 48 hour(s)).  Blood Alcohol level:  Lab Results  Component Value Date   ETH <10 03/01/2019    Metabolic Disorder Labs: No results found for: HGBA1C, MPG Lab Results  Component Value Date   PROLACTIN 18.7 03/03/2019   Lab Results  Component Value Date   CHOL 223 (H) 03/03/2019   TRIG 104 03/03/2019   HDL 73 03/03/2019   CHOLHDL 3.1 03/03/2019   VLDL 21 03/03/2019   LDLCALC 129 (H) 03/03/2019    Physical Findings: AIMS: Facial and Oral Movements Muscles of Facial Expression: None, normal Lips and Perioral Area: None, normal Jaw: None, normal Tongue: None, normal,Extremity Movements Upper (arms, wrists, hands, fingers): None, normal Lower (legs, knees, ankles, toes): None, normal, Trunk Movements Neck, shoulders, hips: None, normal, Overall Severity Severity of abnormal movements (highest score from questions above): None, normal Incapacitation due to abnormal movements: None, normal Patient's awareness of abnormal movements (rate only  patient's report): No Awareness, Dental Status Current problems with teeth and/or dentures?: No Does patient usually wear dentures?: No  CIWA:    COWS:     Musculoskeletal: Strength & Muscle Tone: within normal limits Gait & Station: normal Patient leans: N/A  Psychiatric Specialty Exam: Physical Exam  ROS denies headache or chest pain, no shortness of breath, no cough, no fever, no chills  Blood pressure 116/65, pulse (!) 116, temperature 98.6 F (37 C), resp. rate 20, last menstrual period 02/22/2019, SpO2 99 %, currently breastfeeding.There is no height or weight on file   to calculate BMI.  General Appearance: Improving grooming  Eye Contact:  Good  Speech:  Normal Rate  Volume:  Normal  Mood:  improving mood and range of affect   Affect:  less constricted, smiles at times during session  Thought Process:  Linear and Descriptions of Associations: Intact  Orientation:  Full (Time, Place, and Person)  Thought Content:  No hallucinations, no delusions  Suicidal Thoughts:  No currently denies suicidal ideations and contracts for safety on unit  Homicidal Thoughts:  No  Memory:  Recent and remote grossly intact  Judgement: Improving  Insight:  Fair/improving  Psychomotor Activity:  Decreased  Concentration:  Concentration: Good and Attention Span: Good  Recall:  Good  Fund of Knowledge:  Good  Language:  Good  Akathisia:  Negative  Handed:  Right  AIMS (if indicated):     Assets:  Communication Skills Desire for Improvement Resilience  ADL's:  Intact  Cognition:  WNL  Sleep:  Number of Hours: 5.5   Assessment: 43 year old female, presents for worsening depression, neuro-vegetative symptoms, feeling overwhelmed, passive SI. No psychotic symptoms. Facing significant stressors, including financial and unstable housing . History of chronic depression, has not been on psychiatric medications x several years.   Patient reports feeling better and is presenting with a more  reactive affect . Although cooperative/pleasant on approach, tends to be minimal /superficial in answers at this time. Denies SI and remains future oriented . Tolerating Effexor XR and Remeron well thus far .   Treatment Plan Summary: Daily contact with patient to assess and evaluate symptoms and progress in treatment, Medication management, Plan Inpatient treatment and Medication as below  Treatment plan reviewed as below today 8/23 Encourage group and milieu participation Increase  Remeron to 15  mg nightly for insomnia and depression Continue Effexor XR  75 mg daily for depression Continue Ativan 0.5 mg every 6 hours PRN for anxiety Continue NicoDerm/nicotine patch for nicotine cravings/withdrawal Treatment team working on disposition planning options Jenne Campus, MD 03/05/2019, 11:23 AM   Patient ID: Nilda Simmer, female   DOB: December 11, 1975, 43 y.o.   MRN: [id number]

## 2019-03-05 NOTE — Progress Notes (Signed)
Adult Psychoeducational Group Note  Date:  03/05/2019 Time:  10:21 PM  Group Topic/Focus:  Wrap-Up Group:   The focus of this group is to help patients review their daily goal of treatment and discuss progress on daily workbooks.  Participation Level:  Active  Participation Quality:  Appropriate  Affect:  Appropriate  Cognitive:  Appropriate  Insight: Appropriate  Engagement in Group:  Engaged  Modes of Intervention:  Discussion  Additional Comments:  Pt stated her goal was to get some rest and prepare for discharge.  Pt stated she did meet her goals and rated the day at a 7/10.  Crecencio Kwiatek 03/05/2019, 10:21 PM

## 2019-03-05 NOTE — Progress Notes (Signed)
D. Pt is calm and cooperative,quiet, has minimal interaction with peers- remained in room for much of the shift. Pt states that she would like to go home tomorrow, it being her and her husband's anniversary. Per pt's self inventory, pt rated her depression, hopelessness and anxiety a 3/3/2, respectively.  Pt writes that her goal today is "calling my daughter". Pt currently denies SI/HI and AVH  A. Labs and vitals monitored. Pt compliant with medications. Pt supported emotionally and encouraged to express concerns and ask questions.   R. Pt remains safe with 15 minute checks. Will continue POC.

## 2019-03-05 NOTE — Progress Notes (Signed)
D.  Pt pleasant on approach, no complaints voiced at this time.  Pt was positive for evening wrap up group, observed engaged minimally but appropriately within milieu.  Pt denies SI/HI/AVH at this time.  A.  Support and encouragement offered, medication given as ordered.  R.  Pt remains safe on the unit, will continue to monitor.

## 2019-03-05 NOTE — Progress Notes (Signed)
[facility] Group Notes:  (Nursing/MHT/Case Management/Adjunct)  Date:  03/04/2019  Time:  1330   Type of Therapy:  Nurse Education  Participation Level:  Active  Participation Quality:  Appropriate  Affect:  Appropriate  Cognitive:  Appropriate  Insight:  Appropriate  Engagement in Group:  Engaged  Modes of Intervention:  Discussion and Education  Summary of Progress/Problems:  Lizette Pazos, RN, 03/04/2019  

## 2019-03-05 NOTE — [facility] Group Notes (Signed)
LCSW Group Therapy Note  03/05/2019 9:08 AM  Type of Therapy/Topic: Group Therapy: Feelings about Diagnosis  Participation Level: Did Not Attend   Description of Group:  This group will allow patients to explore their thoughts and feelings about diagnoses they have received. Patients will be guided to explore their level of understanding and acceptance of these diagnoses. Facilitator will encourage patients to process their thoughts and feelings about the reactions of others to their diagnosis and will guide patients in identifying ways to discuss their diagnosis with significant others in their lives. This group will be process-oriented, with patients participating in exploration of their own experiences, giving and receiving support, and processing challenge from other group members.  Therapeutic Goals: 1. Patient will demonstrate understanding of diagnosis as evidenced by identifying two or more symptoms of the disorder 2. Patient will be able to express two feelings regarding the diagnosis 3. Patient will demonstrate their ability to communicate their needs through discussion and/or role play  Summary of Patient Progress:  Invited, did not attend.  Therapeutic Modalities:  Cognitive Behavioral Therapy Brief Therapy Feelings Identification   Stephanie Acre, MSW, [place] Social Worker

## 2019-03-05 NOTE — Progress Notes (Signed)
[place] NOVEL CORONAVIRUS (COVID-19) DAILY CHECK-OFF SYMPTOMS - answer yes or no to each - every day NO YES  Have you had a fever in the past 24 hours?  . Fever (Temp > 37.80C / 100F) X   Have you had any of these symptoms in the past 24 hours? . New Cough .  Sore Throat  .  Shortness of Breath .  Difficulty Breathing .  Unexplained Body Aches   X   Have you had any one of these symptoms in the past 24 hours not related to allergies?   . Runny Nose .  Nasal Congestion .  Sneezing   X   If you have had runny nose, nasal congestion, sneezing in the past 24 hours, has it worsened?  X   EXPOSURES - check yes or no X   Have you traveled outside the state in the past 14 days?  X   Have you been in contact with someone with a confirmed diagnosis of COVID-19 or PUI in the past 14 days without wearing appropriate PPE?  X   Have you been living in the same home as a person with confirmed diagnosis of COVID-19 or a PUI (household contact)?    X   Have you been diagnosed with COVID-19?    X              What to do next: Answered NO to all: Answered YES to anything:   Proceed with unit schedule Follow the [facility] Inpatient Flowsheet.   

## 2019-03-06 NOTE — Progress Notes (Signed)
Recreation Therapy Notes  Date:  8.24.20 Time: 0930 Location: [address] Dayroom  Group Topic: Stress Management  Goal Area(s) Addresses:  Patient will identify positive stress management techniques. Patient will identify benefits of using stress management post d/c.  Intervention: Stress Management  Activity :  Meditation.  LRT played a meditation that focused on making the most of your day.  Patients were to listen and follow along as meditation was played to engage in activity.   Education:  Stress Management, Discharge Planning.   Education Outcome: Acknowledges Education  Clinical Observations/Feedback: Pt did not attend activity.     [name], LRT/CTRS         [name] 03/06/2019 10:55 AM 

## 2019-03-06 NOTE — [facility] Group Notes (Signed)
LCSW Group Therapy Note 03/06/2019 3:01 PM  Type of Therapy and Topic: Group Therapy: Overcoming Obstacles  Participation Level: Did Not Attend  Description of Group:  In this group patients will be encouraged to explore what they see as obstacles to their own wellness and recovery. They will be guided to discuss their thoughts, feelings, and behaviors related to these obstacles. The group will process together ways to cope with barriers, with attention given to specific choices patients can make. Each patient will be challenged to identify changes they are motivated to make in order to overcome their obstacles. This group will be process-oriented, with patients participating in exploration of their own experiences as well as giving and receiving support and challenge from other group members.  Therapeutic Goals: 1. Patient will identify personal and current obstacles as they relate to admission. 2. Patient will identify barriers that currently interfere with their wellness or overcoming obstacles.  3. Patient will identify feelings, thought process and behaviors related to these barriers. 4. Patient will identify two changes they are willing to make to overcome these obstacles:   Summary of Patient Progress  Invited, chose not to attend.    Therapeutic Modalities:  Cognitive Behavioral Therapy Solution Focused Therapy Motivational Interviewing Relapse Prevention Therapy   Theresa Duty Clinical Social Worker

## 2019-03-06 NOTE — Progress Notes (Signed)
D:  Patient's self inventory sheet, patient sleeps good, sleep medication helpful.  Good appetite, normal energy level, good concentration.  Rated depression 6, hopeless 4, anxiety 3.  Denied withdrawals.  Denied SI.  Denied physical problems.  Denied physical pain.  Goal is discharge.  Plans to try to go home.  Does have discharge plans. A:  Medications administered per MD orders.  Emotional support and encouragement given patient. ]R:  Denied SI and HI, contracts for safety.  Denied A/V hallucinations.  Safety maintained with 15 minute checks.

## 2019-03-06 NOTE — Progress Notes (Signed)
Spiritual care group on grief and loss facilitated by chaplain Rasheema Truluck  Group Goal:  Support / Education around grief and loss Members engage in facilitated group support and psycho-social education.  Group Description:  Following introductions and group rules, group members engaged in facilitated group dialog and support around topic of loss, with particular support around experiences of loss in their lives. Group Identified types of loss (relationships / self / things) and identified patterns, circumstances, and changes that precipitate losses. Reflected on thoughts / feelings around loss, normalized grief responses, and recognized variety in grief experience. Patient Progress: Did not attend  

## 2019-03-06 NOTE — Plan of Care (Signed)
Nurse discussed anxiety, depression and coping skills with patient.  

## 2019-03-06 NOTE — Progress Notes (Signed)
Adult Psychoeducational Group Note  Date:  03/06/2019 Time:  10:02 PM  Group Topic/Focus:  Wrap-Up Group:   The focus of this group is to help patients review their daily goal of treatment and discuss progress on daily workbooks.  Participation Level:  Active  Participation Quality:  Appropriate  Affect:  Appropriate  Cognitive:  Appropriate  Insight: Appropriate  Engagement in Group:  Engaged  Modes of Intervention:  Discussion  Additional Comments:  Pt stated her goal was to speak with the doctor about being discharged.  Pt reported she is being discharged tomorrow.  Pt stated she did achieve her goal and rated the day at a 7/10.  Shelsie Tijerino 03/06/2019, 10:02 PM

## 2019-03-06 NOTE — Progress Notes (Signed)
[facility] MD Progress Note  03/06/2019 2:31 PM RAIANNA SLIGHT  MRN:  [id number] Subjective: Patient describes improving mood and states she is feeling better.  As she improves she is focusing on discharge planning and hoping for discharge soon.  She continues to worry about stressors (unstable housing, financial constraints, currently living with her sister-in-law , power disconnected at times due to financial constraints), but describes feeling more optimistic and  that she will be able to cope with current situation and that it will gradually improve.  Currently denies suicidal ideations, presents future oriented.  Denies medication side effects.   Objective: Have discussed case with treatment team and have met with patient.  43 year old female, presents for worsening depression, neuro-vegetative symptoms, feeling overwhelmed, passive SI. No psychotic symptoms. Facing significant stressors, including financial and unstable housing . History of chronic depression, has not been on psychiatric medications x several years.  Presents with improving mood and a fuller range of affect.  Denies suicidal ideations and currently presents future oriented.  As above , is becoming more focused on discharge planning, hoping for discharge soon to reunite with her husband and child. Currently on Remeron/Effexor XR trial.  Tolerating well thus far, denies side effects.  Side effects reviewed, to include potential for weight gain and sedation from Remeron and potential for cardiovascular side effects/hypertension on Effexor XR, as well as potential for venlafaxine withdrawal if medication stopped abruptly. No disruptive or agitated behaviors on unit, limited group participation.  We reviewed this: She states that some peers " jist joke around and don't take being here seriously".   Principal Problem:  MDD Diagnosis: Active Problems:   Major depressive disorder, recurrent episode ([place])  Total Time spent with patient:  20 minutes  Past Psychiatric History:   Past Medical History:  Past Medical History:  Diagnosis Date  . Depression    on meds- doing well  . GERD (gastroesophageal reflux disease)    ocasional with pregnancy only  . Gestational diabetes    History with previous pregnancy 2009  . High-risk pregnancy   . Infection    UTI  . Preeclampsia    History only with first preg 2006  . Pregnancy induced hypertension    first preg  . Preterm labor    1st delivery    Past Surgical History:  Procedure Laterality Date  . CESAREAN SECTION    . CESAREAN SECTION  05/16/2012   Procedure: CESAREAN SECTION;  Surgeon: Cheri Fowler, MD;  Location: [facility];  Service: Obstetrics;  Laterality: N/A;  . CESAREAN SECTION N/A 11/13/2013   Procedure: CESAREAN SECTION;  Surgeon: Cheri Fowler, MD;  Location: [facility];  Service: Obstetrics;  Laterality: N/A;  1 1/2hrs OR time  . DILATION AND CURETTAGE OF UTERUS    . THERAPEUTIC ABORTION    . WISDOM TOOTH EXTRACTION     Family History:  Family History  Problem Relation Age of Onset  . Heart disease Father   . Heart disease Paternal Grandfather    Family Psychiatric  History:  Social History:  Social History   Substance and Sexual Activity  Alcohol Use No     Social History   Substance and Sexual Activity  Drug Use No    Social History   Socioeconomic History  . Marital status: Divorced    Spouse name: Not on file  . Number of children: Not on file  . Years of education: Not on file  . Highest education level: Not on file  Occupational History  . Not on file  Social Needs  . Financial resource strain: Not on file  . Food insecurity    Worry: Not on file    Inability: Not on file  . Transportation needs    Medical: Not on file    Non-medical: Not on file  Tobacco Use  . Smoking status: Current Every Day Smoker    Packs/day: 0.50    Years: 15.00    Pack years: 7.50    Types: Cigarettes    Last attempt to quit: 03/13/2013    Years  since quitting: 5.9  . Smokeless tobacco: Never Used  Substance and Sexual Activity  . Alcohol use: No  . Drug use: No  . Sexual activity: Yes    Birth control/protection: None    Comment: pregnant  Lifestyle  . Physical activity    Days per week: Not on file    Minutes per session: Not on file  . Stress: Not on file  Relationships  . Social Herbalist on phone: Not on file    Gets together: Not on file    Attends religious service: Not on file    Active member of club or organization: Not on file    Attends meetings of clubs or organizations: Not on file    Relationship status: Not on file  Other Topics Concern  . Not on file  Social History Narrative  . Not on file   Additional Social History:   Sleep: Improving  Appetite:  improving   Current Medications: Current Facility-Administered Medications  Medication Dose Route Frequency Provider Last Rate Last Dose  . acetaminophen (TYLENOL) tablet 650 mg  650 mg Oral Q6H PRN Nwoko, Herbert Pun I, NP      . alum & mag hydroxide-simeth (MAALOX/MYLANTA) 200-200-20 MG/5ML suspension 30 mL  30 mL Oral Q4H PRN Nwoko, Agnes I, NP      . LORazepam (ATIVAN) tablet 0.5 mg  0.5 mg Oral Q6H PRN Antanisha Mohs, Myer Peer, MD   0.5 mg at 03/05/19 2054  . magnesium hydroxide (MILK OF MAGNESIA) suspension 30 mL  30 mL Oral Daily PRN Nwoko, Agnes I, NP      . mirtazapine (REMERON) tablet 15 mg  15 mg Oral QHS Haidyn Chadderdon, Myer Peer, MD   15 mg at 03/05/19 2054  . nicotine (NICODERM CQ - dosed in mg/24 hours) patch 21 mg  21 mg Transdermal Daily Mani Celestin, Myer Peer, MD   21 mg at 03/06/19 0816  . venlafaxine XR (EFFEXOR-XR) 24 hr capsule 75 mg  75 mg Oral Q breakfast Ark Agrusa, Myer Peer, MD   75 mg at 03/06/19 6962    Lab Results:  No results found for this or any previous visit (from the past 48 hour(s)).  Blood Alcohol level:  Lab Results  Component Value Date   ETH <10 95/28/4132    Metabolic Disorder Labs: No results found for: HGBA1C,  MPG Lab Results  Component Value Date   PROLACTIN 18.7 03/03/2019   Lab Results  Component Value Date   CHOL 223 (H) 03/03/2019   TRIG 104 03/03/2019   HDL 73 03/03/2019   CHOLHDL 3.1 03/03/2019   VLDL 21 03/03/2019   LDLCALC 129 (H) 03/03/2019    Physical Findings: AIMS: Facial and Oral Movements Muscles of Facial Expression: None, normal Lips and Perioral Area: None, normal Jaw: None, normal Tongue: None, normal,Extremity Movements Upper (arms, wrists, hands, fingers): None, normal Lower (legs, knees, ankles, toes): None, normal, Trunk Movements  Neck, shoulders, hips: None, normal, Overall Severity Severity of abnormal movements (highest score from questions above): None, normal Incapacitation due to abnormal movements: None, normal Patient's awareness of abnormal movements (rate only patient's report): No Awareness, Dental Status Current problems with teeth and/or dentures?: No Does patient usually wear dentures?: No  CIWA:  CIWA-Ar Total: 1 COWS:  COWS Total Score: 2  Musculoskeletal: Strength & Muscle Tone: within normal limits Gait & Station: normal Patient leans: N/A  Psychiatric Specialty Exam: Physical Exam  ROS denies headache or chest pain, no shortness of breath, no cough, no fever, no chills  Blood pressure (!) 100/58, pulse 100, temperature 97.7 F (36.5 C), resp. rate 20, last menstrual period 02/22/2019, SpO2 99 %, currently breastfeeding.There is no height or weight on file to calculate BMI.  General Appearance: Improving grooming  Eye Contact:  Good  Speech:  Normal Rate  Volume:  Normal  Mood:  Improving  Affect:  More reactive/brighter affect, smiles at times appropriately  Thought Process:  Linear and Descriptions of Associations: Intact  Orientation:  Full (Time, Place, and Person)  Thought Content:  No hallucinations, no delusions/not internally preoccupied  Suicidal Thoughts:  No currently denies suicidal ideations and contracts for safety on  unit  Homicidal Thoughts:  No  Memory:  Recent and remote grossly intact  Judgement: Improving  Insight:  Fair/improving  Psychomotor Activity:  Normal  Concentration:  Concentration: Good and Attention Span: Good  Recall:  Good  Fund of Knowledge:  Good  Language:  Good  Akathisia:  Negative  Handed:  Right  AIMS (if indicated):     Assets:  Communication Skills Desire for Improvement Resilience  ADL's:  Intact  Cognition:  WNL  Sleep:  Number of Hours: 6.5   Assessment: 43 year old female, presents for worsening depression, neuro-vegetative symptoms, feeling overwhelmed, passive SI. No psychotic symptoms. Facing significant stressors, including financial and unstable housing . History of chronic depression, has not been on psychiatric medications x several years.   Patient presents with improving mood and range of affect.  Denies suicidal ideations at this time and presents future oriented.  Identifies love for her child and family has protective factors against self-harm.  Denies medication side effects thus far.  Limited milieu participation.  Treatment Plan Summary: Daily contact with patient to assess and evaluate symptoms and progress in treatment, Medication management, Plan Inpatient treatment and Medication as below  Treatment plan reviewed as below today 8/24 Encourage group and milieu participation Continue Remeron  15  mg nightly for insomnia and depression Continue Effexor XR  75 mg daily for depression Continue Ativan 0.5 mg every 6 hours PRN for anxiety Continue NicoDerm/nicotine patch for nicotine cravings/withdrawal Treatment team working on disposition planning options Jenne Campus, MD 03/06/2019, 2:31 PM   Patient ID: Nilda Simmer, female   DOB: 03/09/1976, 43 y.o.   MRN: [id number]

## 2019-03-07 MED ORDER — NICOTINE 21 MG/24HR TD PT24: 21.0000 mg | MEDICATED_PATCH | Freq: Every day | TRANSDERMAL | 0 refills | Status: AC

## 2019-03-07 MED ORDER — VENLAFAXINE HCL ER 75 MG PO CP24: 75.0000 mg | ORAL_CAPSULE | Freq: Every day | ORAL | 0 refills | Status: AC

## 2019-03-07 MED ORDER — MIRTAZAPINE 15 MG PO TABS: 15.0000 mg | ORAL_TABLET | Freq: Every day | ORAL | 0 refills | Status: AC

## 2019-03-07 NOTE — BHH Suicide Risk Assessment (Signed)
[facility] Discharge Suicide Risk Assessment   Principal Problem: <principal problem not specified> Discharge Diagnoses: Active Problems:   Major depressive disorder, recurrent episode ([place])   Total Time spent with patient: 15 minutes  Musculoskeletal: Strength & Muscle Tone: within normal limits Gait & Station: normal Patient leans: N/A  Psychiatric Specialty Exam: Review of Systems  All other systems reviewed and are negative.   Blood pressure 97/74, pulse (!) 115, temperature 98.1 F (36.7 C), temperature source Oral, resp. rate 20, last menstrual period 02/22/2019, SpO2 99 %, currently breastfeeding.There is no height or weight on file to calculate BMI.  General Appearance: Casual  Eye Contact::  Fair  Speech:  Normal Rate409  Volume:  Decreased  Mood:  Euthymic  Affect:  Congruent  Thought Process:  Coherent and Descriptions of Associations: Intact  Orientation:  Full (Time, Place, and Person)  Thought Content:  Logical  Suicidal Thoughts:  No  Homicidal Thoughts:  No  Memory:  Immediate;   Fair Recent;   Fair Remote;   Fair  Judgement:  Fair  Insight:  Fair  Psychomotor Activity:  Decreased  Concentration:  Fair  Recall:  [employer] of Knowledge:Fair  Language: Good  Akathisia:  Negative  Handed:  Right  AIMS (if indicated):     Assets:  Desire for Improvement Housing  Sleep:  Number of Hours: 6.5  Cognition: WNL  ADL's:  Intact   Mental Status Per Nursing Assessment::   On Admission:  Suicidal ideation indicated by patient, Self-harm thoughts  Demographic Factors:  Caucasian and Low socioeconomic status  Loss Factors: NA  Historical Factors: Impulsivity  Risk Reduction Factors:   Sense of responsibility to family, Living with another person, especially a relative and Positive social support  Continued Clinical Symptoms:  Depression:   Impulsivity Alcohol/Substance Abuse/Dependencies  Cognitive Features That Contribute To Risk:  None    Suicide  Risk:  Minimal: No identifiable suicidal ideation.  Patients presenting with no risk factors but with morbid ruminations; may be classified as minimal risk based on the severity of the depressive symptoms    Plan Of Care/Follow-up recommendations:  Activity:  ad lib  Sharma Covert, MD 03/07/2019, 8:51 AM

## 2019-03-07 NOTE — Progress Notes (Signed)
Recreation Therapy Notes  Animal-Assisted Activity (AAA) Program Checklist/Progress Notes  Date: 8.25.20 Time: 57 Location: 8 Film/video editor  AAA/T Program Assumption of Risk Form signed by Radiation protection practitioner Guardian  YES  Patient is free of allergies or sever asthma  YES   Patient reports no fear of animals  YES   Patient reports no history of cruelty to animals  YES  Patient understands his/her participation is voluntary  YES   Patient washes hands before animal contact YES   Patient washes hands after animal contact YES   Behavioral Response:  Engaged  Education: Contractor, Appropriate Animal Interaction   Education Outcome: Acknowledges understanding/In group clarification offered/Needs additional education.   Clinical Observations/Feedback:  Pt participated and engaged in activity.     Victorino Sparrow, LRT/CTRS         Victorino Sparrow A 03/07/2019 3:23 PM

## 2019-03-07 NOTE — Progress Notes (Signed)
Discharge note  Patient verbalizes readiness for discharge. Follow up plan explained, AVS, Transition record and SRA given. Prescriptions and teaching provided. Belongings returned and signed for. Suicide safety plan completed and signed. Patient verbalizes understanding. Patient denies SI/HI and assures this writer she will seek assistance should that change. Patient discharged to lobby where pt was instructed to wait for Lyft.

## 2019-03-07 NOTE — Discharge Summary (Signed)
Physician Discharge Summary Note  Patient:  Dana [name] is an 43 y.o., female MRN:  [id number] DOB:  1976-06-06 Patient phone:  [phone] (home)  Patient address:   [address] [place] [address],  Total Time spent with patient: 15 minutes  Date of Admission:  03/02/2019 Date of Discharge: 03/07/2019  Reason for Admission:  Suicidal ideation  Principal Problem: <principal problem not specified> Discharge Diagnoses: Active Problems:   Major depressive disorder, recurrent episode ([place])   Past Psychiatric History:  history of a prior psychiatric admission for suicidal ideations. Denies history of self cutting. Denies history of suicidal attempts. Denies history of psychosis. Reports history of depression, which she characterizes as chronic and which she states waxes and wanes but is present chronically. Denies history of mania or hypomania.  Does not endorse history of PTSD. Denies panic or agoraphobia, does endorse feeling she worries excessively. Denies history of violence .  Past Medical History:  Past Medical History:  Diagnosis Date  . Depression    on meds- doing well  . GERD (gastroesophageal reflux disease)    ocasional with pregnancy only  . Gestational diabetes    History with previous pregnancy 2009  . High-risk pregnancy   . Infection    UTI  . Preeclampsia    History only with first preg 2006  . Pregnancy induced hypertension    first preg  . Preterm labor    1st delivery    Past Surgical History:  Procedure Laterality Date  . CESAREAN SECTION    . CESAREAN SECTION  05/16/2012   Procedure: CESAREAN SECTION;  Surgeon: Cheri Fowler, MD;  Location: [facility];  Service: Obstetrics;  Laterality: N/A;  . CESAREAN SECTION N/A 11/13/2013   Procedure: CESAREAN SECTION;  Surgeon: Cheri Fowler, MD;  Location: [facility];  Service: Obstetrics;  Laterality: N/A;  1 1/2hrs OR time  . DILATION AND CURETTAGE OF UTERUS    . THERAPEUTIC ABORTION    . WISDOM TOOTH  EXTRACTION     Family History:  Family History  Problem Relation Age of Onset  . Heart disease Father   . Heart disease Paternal Grandfather    Family Psychiatric  History: mother has history of depression, no suicides in family, paternal grandfather had history of alcoholism  Social History:  Social History   Substance and Sexual Activity  Alcohol Use No     Social History   Substance and Sexual Activity  Drug Use No    Social History   Socioeconomic History  . Marital status: Divorced    Spouse name: Not on file  . Number of children: Not on file  . Years of education: Not on file  . Highest education level: Not on file  Occupational History  . Not on file  Social Needs  . Financial resource strain: Not on file  . Food insecurity    Worry: Not on file    Inability: Not on file  . Transportation needs    Medical: Not on file    Non-medical: Not on file  Tobacco Use  . Smoking status: Current Every Day Smoker    Packs/day: 0.50    Years: 15.00    Pack years: 7.50    Types: Cigarettes    Last attempt to quit: 03/13/2013    Years since quitting: 5.9  . Smokeless tobacco: Never Used  Substance and Sexual Activity  . Alcohol use: No  . Drug use: No  . Sexual activity: Yes    Birth  control/protection: None    Comment: pregnant  Lifestyle  . Physical activity    Days per week: Not on file    Minutes per session: Not on file  . Stress: Not on file  Relationships  . Social Musicianconnections    Talks on phone: Not on file    Gets together: Not on file    Attends religious service: Not on file    Active member of club or organization: Not on file    Attends meetings of clubs or organizations: Not on file    Relationship status: Not on file  Other Topics Concern  . Not on file  Social History Narrative  . Not on file    Hospital Course:  From admission H & P 43 year old female, presented to hospital voluntarily reporting worsening depression , feeling overwhelmed,  neuro-vegetative symptoms of depression. She has had some passive SI but denies any suicidal plans or intentions and states " I know my family needs me". She reports " I don't want to die, but I have been feeling empty". She describes significant stressors, including  homelessness ( she and her husband are currently staying with his sister), difficulty paying for utilities due to which there has been no power at times ,  lack of transportation, unemployment,which has resulted in other issues such as not being able to enroll her daughter in school as not knowing where she will be residing. She describes a long history of depression, and states she has struggled with depression for years . She has not been on psychiatric medications for several years . Denies alcohol or drug abuse . Ms Dana [name] was admitted for depression with passive suicidal ideation. Inpatient for 5 days, medications ordered include Remeron and Effexor. Patient participated in group therapy while inpatient. Responded well to treatment with no side effects recorded. Patient has shown improved mood, affect, sleep and interaction. Patient has been active and visible in the milieu, appeared motivated for treatment. Patient future oriented, looking forward to "going home" and "get back to my life." Patient denies SI/HI/AVH and contracts for safety. Patient is discharging on the medications listed below.[facility]see [facility]). Patient provided with prescriptions for medications at discharge. Patient discharging home, will be provided transportation home via [employer].   Physical Findings: AIMS: Facial and Oral Movements Muscles of Facial Expression: None, normal Lips and Perioral Area: None, normal Jaw: None, normal Tongue: None, normal,Extremity Movements Upper (arms, wrists, hands, fingers): None, normal Lower (legs, knees, ankles, toes): None, normal, Trunk Movements Neck, shoulders, hips: None, normal, Overall  Severity Severity of abnormal movements (highest score from questions above): None, normal Incapacitation due to abnormal movements: None, normal Patient's awareness of abnormal movements (rate only patient's report): No Awareness, Dental Status Current problems with teeth and/or dentures?: No Does patient usually wear dentures?: No  CIWA:  CIWA-Ar Total: 1 COWS:  COWS Total Score: 3  Musculoskeletal: Strength & Muscle Tone: within normal limits Gait & Station: normal Patient leans: N/A  Psychiatric Specialty Exam: Physical Exam  Nursing note and vitals reviewed. Constitutional: She is oriented to person, place, and time. She appears well-developed.  HENT:  Head: Normocephalic.  Respiratory: Effort normal.  Neurological: She is alert and oriented to person, place, and time.    Review of Systems  Constitutional: Negative.   Psychiatric/Behavioral: Positive for depression (stable on medication). Negative for hallucinations, memory loss and suicidal ideas. The patient is not nervous/anxious and does not have insomnia.  Blood pressure 114/70, pulse (!) 115, temperature 98.1 F (36.7 C), temperature source Oral, resp. rate 20, last menstrual period 02/22/2019, SpO2 99 %, currently breastfeeding.There is no height or weight on file to calculate BMI.  See MD's discharge SRA.      Has this patient used any form of tobacco in the last 30 days? (Cigarettes, Smokeless Tobacco, Cigars, and/or Pipes) Yes, Patient provided a prescription for tobacco cessation treatment.   Blood Alcohol level:  Lab Results  Component Value Date   ETH <10 03/01/2019    Metabolic Disorder Labs:  No results found for: HGBA1C, MPG Lab Results  Component Value Date   PROLACTIN 18.7 03/03/2019   Lab Results  Component Value Date   CHOL 223 (H) 03/03/2019   TRIG 104 03/03/2019   HDL 73 03/03/2019   CHOLHDL 3.1 03/03/2019   VLDL 21 03/03/2019   LDLCALC 129 (H) 03/03/2019    See Psychiatric  Specialty Exam and Suicide Risk Assessment completed by Attending Physician prior to discharge.  Discharge destination:  Home  Is patient on multiple antipsychotic therapies at discharge:  No   Has Patient had three or more failed trials of antipsychotic monotherapy by history:  No  Recommended Plan for Multiple Antipsychotic Therapies: NA  Discharge Instructions    Discharge instructions   Complete by: As directed    Patient is instructed to take all prescribed medications as recommended. Report any side effects or adverse reactions to your outpatient psychiatrist. Patient is instructed to abstain from alcohol and illegal drugs while on prescription medications. In the event of worsening symptoms, patient is instructed to call the crisis hotline, 911, or go to the nearest emergency department for evaluation and treatment.     Allergies as of 03/07/2019      Reactions   Flagyl [metronidazole] Rash      Medication List    STOP taking these medications   acetaminophen 500 MG tablet Commonly known as: TYLENOL   benzonatate 100 MG capsule Commonly known as: TESSALON     TAKE these medications     Indication  mirtazapine 15 MG tablet Commonly known as: REMERON Take 1 tablet (15 mg total) by mouth at bedtime.  Indication: Major Depressive Disorder   nicotine 21 mg/24hr patch Commonly known as: NICODERM CQ - dosed in mg/24 hours Place 1 patch (21 mg total) onto the skin daily. Start taking on: March 08, 2019  Indication: Nicotine Addiction   venlafaxine XR 75 MG 24 hr capsule Commonly known as: EFFEXOR-XR Take 1 capsule (75 mg total) by mouth daily with breakfast. Start taking on: March 08, 2019  Indication: Major Depressive Disorder      Follow-up Information    [facility] Follow up on 03/14/2019.   Why: Telephonic hospital follow up appointment is Tuesday, 9/1 at 1:00p.  The provider will contact you day of appointment.  Contact information: [address] [place] [id number] [phone]           Follow-up recommendations:  Take all medications as prescribed. Please attend all follow-up appointments as scheduled. Report any side effects to your outpatient psychiatrist. Abstain from alcohol and illegal drugs while taking prescription medications. In the event of worsening symptoms call the crisis hotline, 911 or go to the nearest emergency department for evaluation and treatment.     Signed: Patrcia Dollyina L Tate, FNP 03/07/2019, 11:02 AM

## 2019-03-07 NOTE — Progress Notes (Signed)
  [facility] Adult Case Management Discharge Plan :  Will you be returning to the same living situation after discharge:  Yes,  home At discharge, do you have transportation home?: Yes,  Lyft/[employer] Do you have the ability to pay for your medications: No. Provided samples and referred to [facility].  Release of information consent forms completed and in the chart.  Patient to Follow up at: Follow-up Information    [facility] Follow up on 03/14/2019.   Why: Telephonic hospital follow up appointment is Tuesday, 9/1 at 1:00p.  The provider will contact you day of appointment.  Contact information: [address] [id number] [phone]           Next level of care provider has access to [facility] and Suicide Prevention discussed: Yes,  with patient.   Has patient been referred to the Quitline?: Patient refused referral  Patient has been referred for addiction treatment: Yes  Dana [name], Dana [name] 03/07/2019, 10:11 AM

## 2019-03-07 NOTE — Progress Notes (Signed)
D:  Patient denied SI and HI, contracts for safety.  Denied A/V hallucinations.   A:  Medications administered per MD orders.  Emotional support and encouragement given patient. R:  Safety maintained with 15 minute checks.  

## 2020-05-14 ENCOUNTER — Emergency Department (HOSPITAL_COMMUNITY)
Admission: EM | Admit: 2020-05-14 | Discharge: 2020-05-14 | Disposition: A | Discharge status: Home / Self Care | Payer: Medicaid Other | Attending: Emergency Medicine | Admitting: Emergency Medicine

## 2020-05-14 ENCOUNTER — Encounter (HOSPITAL_COMMUNITY): Payer: Self-pay

## 2020-05-14 ENCOUNTER — Other Ambulatory Visit: Payer: Self-pay

## 2020-05-14 ENCOUNTER — Emergency Department (HOSPITAL_COMMUNITY): Payer: Medicaid Other

## 2020-05-14 DIAGNOSIS — F17210 Nicotine dependence, cigarettes, uncomplicated: Secondary | ICD-10-CM | POA: Diagnosis not present

## 2020-05-14 DIAGNOSIS — R220 Localized swelling, mass and lump, head: Secondary | ICD-10-CM | POA: Diagnosis present

## 2020-05-14 DIAGNOSIS — L03211 Cellulitis of face: Secondary | ICD-10-CM | POA: Diagnosis not present

## 2020-05-14 LAB — CBC WITH DIFFERENTIAL/PLATELET
Abs Immature Granulocytes: 0.03 10*3/uL (ref 0.00–0.07)
Basophils Absolute: 0 10*3/uL (ref 0.0–0.1)
Basophils Relative: 0 %
Eosinophils Absolute: 0.1 10*3/uL (ref 0.0–0.5)
Eosinophils Relative: 2 %
HCT: 37.7 % (ref 36.0–46.0)
Hemoglobin: 12.5 g/dL (ref 12.0–15.0)
Immature Granulocytes: 0 %
Lymphocytes Relative: 20 %
Lymphs Abs: 1.9 10*3/uL (ref 0.7–4.0)
MCH: 30.6 pg (ref 26.0–34.0)
MCHC: 33.2 g/dL (ref 30.0–36.0)
MCV: 92.4 fL (ref 80.0–100.0)
Monocytes Absolute: 0.8 10*3/uL (ref 0.1–1.0)
Monocytes Relative: 9 %
Neutro Abs: 6.5 10*3/uL (ref 1.7–7.7)
Neutrophils Relative %: 69 %
Platelets: 235 10*3/uL (ref 150–400)
RBC: 4.08 MIL/uL (ref 3.87–5.11)
RDW: 13.1 % (ref 11.5–15.5)
WBC: 9.4 10*3/uL (ref 4.0–10.5)
nRBC: 0 % (ref 0.0–0.2)

## 2020-05-14 LAB — BASIC METABOLIC PANEL
Anion gap: 10 (ref 5–15)
BUN: 18 mg/dL (ref 6–20)
CO2: 28 mmol/L (ref 22–32)
Calcium: 9.6 mg/dL (ref 8.9–10.3)
Chloride: 100 mmol/L (ref 98–111)
Creatinine, Ser: 0.69 mg/dL (ref 0.44–1.00)
GFR, Estimated: >60 mL/min (ref >60)
Glucose, Bld: 100 mg/dL — ABNORMAL HIGH (ref 70–99)
Potassium: 3.3 mmol/L — ABNORMAL LOW (ref 3.5–5.1)
Sodium: 138 mmol/L (ref 135–145)

## 2020-05-14 LAB — I-STAT BETA HCG BLOOD, ED (MC, WL, AP ONLY): I-stat hCG, quantitative: <5 m[IU]/mL (ref <5)

## 2020-05-14 MED ORDER — NAPROXEN 500 MG PO TABS: 500.0000 mg | ORAL_TABLET | Freq: Two times a day (BID) | ORAL | 0 refills | Status: AC

## 2020-05-14 MED ORDER — CLINDAMYCIN HCL 150 MG PO CAPS: 300.0000 mg | ORAL_CAPSULE | Freq: Three times a day (TID) | ORAL | 0 refills | Status: AC

## 2020-05-14 MED ORDER — SODIUM CHLORIDE 0.9 % IV SOLN
1.0000 g | Freq: Once | INTRAVENOUS | Status: AC
  Administered 2020-05-14: 1 g via INTRAVENOUS
  Filled 2020-05-14: qty 10

## 2020-05-14 MED ORDER — IOHEXOL 300 MG/ML  SOLN
75.0000 mL | Freq: Once | INTRAMUSCULAR | Status: AC | PRN
  Administered 2020-05-14: 75 mL via INTRAVENOUS

## 2020-05-14 MED ORDER — MUPIROCIN 2 % EX OINT: 1.0000 "application " | TOPICAL_OINTMENT | Freq: Three times a day (TID) | CUTANEOUS | 0 refills | Status: AC

## 2020-05-14 MED ORDER — SODIUM CHLORIDE 0.9 % IV SOLN: 1.0000 g | Freq: Once | INTRAVENOUS | Status: DC

## 2020-05-14 NOTE — Progress Notes (Signed)
..   Transition of Care [facility]) - Emergency Department Mini Assessment   Patient Details  Name: Dana [name] MRN: [id number] Date of Birth: August 31, 1975  Transition of Care [facility]) CM/SW Contact:    Elliot Cousin, RN Phone Number: [phone] 05/14/2020, 2:52 PM   Clinical Narrative: TOC CM spoke to pt and states she is not sure if she has Medicaid. [facility] on [address] and faxed facesheet with pt insurance information. Contacted [facility] and got pt an appt for 05/21/2020 at 115 pm. Contacted pt with information. ED provider updated.    ED Mini Assessment: What brought you to the Emergency Department? : facial pain and swelling  Barriers to Discharge: No Barriers Identified     Means of departure: Car  Interventions which prevented an admission or readmission: Medication Review, PCP Appointment Scheduled    Patient Contact and Communications        ,                 Admission diagnosis:  Facial Swelling, sore throat Patient Active Problem List   Diagnosis Date Noted  . Facial cellulitis   . MDD (major depressive disorder) 03/02/2019  . MDD (major depressive disorder), recurrent severe, without psychosis (HCC) 03/02/2019  . Major depressive disorder, recurrent episode (HCC) 03/02/2019  . S/P cesarean section 11/13/2013   PCP:  Patient, No Pcp Per Pharmacy:   [id number] [place], [place] - [address] [place] [address] Phone: [phone] Fax: [phone]

## 2020-05-14 NOTE — Discharge Instructions (Addendum)
Take the antibiotics as prescribed. Apply warm compresses to the area. The symptoms should start improving in a couple of days. Follow-up with your primary care doctor next week. Return to the ED for worsening symptoms high fevers

## 2020-05-14 NOTE — Consult Note (Signed)
Reason for Consult: Facial swelling Referring Physician: ER attending  Dana [name] is an 44 y.o. female.  HPI: 1 day history of left facial swelling.  She had a small pimple which she was picking at and subsequently developed pain and discomfort over the left face.  She has a long past medical history and has thus far received no treatment for this problem.  Past Medical History:  Diagnosis Date  . Depression    on meds- doing well  . GERD (gastroesophageal reflux disease)    ocasional with pregnancy only  . Gestational diabetes    History with previous pregnancy 2009  . High-risk pregnancy   . Infection    UTI  . Preeclampsia    History only with first preg 2006  . Pregnancy induced hypertension    first preg  . Preterm labor    1st delivery    Past Surgical History:  Procedure Laterality Date  . CESAREAN SECTION    . CESAREAN SECTION  05/16/2012   Procedure: CESAREAN SECTION;  Surgeon: Lavina Hamman, MD;  Location: [facility];  Service: Obstetrics;  Laterality: N/A;  . CESAREAN SECTION N/A 11/13/2013   Procedure: CESAREAN SECTION;  Surgeon: Lavina Hamman, MD;  Location: [facility];  Service: Obstetrics;  Laterality: N/A;  1 1/2hrs OR time  . DILATION AND CURETTAGE OF UTERUS    . THERAPEUTIC ABORTION    . WISDOM TOOTH EXTRACTION      Family History  Problem Relation Age of Onset  . Heart disease Father   . Heart disease Paternal Grandfather     Social History:  reports that she has been smoking cigarettes. She has a 7.50 pack-year smoking history. She has never used smokeless tobacco. She reports that she does not drink alcohol and does not use drugs.  Allergies:  Allergies  Allergen Reactions  . Flagyl [Metronidazole] Rash    Medications: I have reviewed the patient's current medications.  Results for orders placed or performed during the hospital encounter of 05/14/20 (from the past 48 hour(s))  CBC with Differential/Platelet     Status: None   Collection Time:  05/14/20 11:04 AM  Result Value Ref Range   WBC 9.4 4.0 - 10.5 K/uL   RBC 4.08 3.87 - 5.11 MIL/uL   Hemoglobin 12.5 12.0 - 15.0 g/dL   HCT 40.9 36 - 46 %   MCV 92.4 80.0 - 100.0 fL   MCH 30.6 26.0 - 34.0 pg   MCHC 33.2 30.0 - 36.0 g/dL   RDW 81.1 91.4 - 78.2 %   Platelets 235 150 - 400 K/uL   nRBC 0.0 0.0 - 0.2 %   Neutrophils Relative % 69 %   Neutro Abs 6.5 1.7 - 7.7 K/uL   Lymphocytes Relative 20 %   Lymphs Abs 1.9 0.7 - 4.0 K/uL   Monocytes Relative 9 %   Monocytes Absolute 0.8 0.1 - 1.0 K/uL   Eosinophils Relative 2 %   Eosinophils Absolute 0.1 0.0 - 0.5 K/uL   Basophils Relative 0 %   Basophils Absolute 0.0 0.0 - 0.1 K/uL   Immature Granulocytes 0 %   Abs Immature Granulocytes 0.03 0.00 - 0.07 K/uL    Comment: Performed at [facility], [address], [place], [place] [address]  Basic metabolic panel     Status: Abnormal   Collection Time: 05/14/20 11:04 AM  Result Value Ref Range   Sodium 138 135 - 145 mmol/L   Potassium 3.3 (L) 3.5 - 5.1  mmol/L   Chloride 100 98 - 111 mmol/L   CO2 28 22 - 32 mmol/L   Glucose, Bld 100 (H) 70 - 99 mg/dL    Comment: Glucose reference range applies only to samples taken after fasting for at least 8 hours.   BUN 18 6 - 20 mg/dL   Creatinine, Ser 7.74 0.44 - 1.00 mg/dL   Calcium 9.6 8.9 - 12.8 mg/dL   GFR, Estimated >78 >67 mL/min    Comment: (NOTE) Calculated using the CKD-EPI Creatinine Equation (2021)    Anion gap 10 5 - 15    Comment: Performed at [facility], [address], [place], [place] [address]  I-Stat Beta hCG blood, ED ([facility], [facility], [facility] only)     Status: None   Collection Time: 05/14/20 11:18 AM  Result Value Ref Range   I-stat hCG, quantitative <5.0 <5 mIU/mL   Comment 3            Comment:   GEST. AGE      CONC.  (mIU/mL)   <=1 WEEK        5 - 50     2 WEEKS       50 - 500     3 WEEKS       100 - 10,000     4 WEEKS     1,000 - 30,000        FEMALE AND NON-PREGNANT FEMALE:      LESS THAN 5 mIU/mL     CT Maxillofacial W Contrast  Result Date: 05/14/2020 CLINICAL DATA:  Maxillary/facial abscess. EXAM: CT MAXILLOFACIAL WITH CONTRAST TECHNIQUE: Multidetector CT imaging of the maxillofacial structures was performed with intravenous contrast. Multiplanar CT image reconstructions were also generated. CONTRAST:  27mL OMNIPAQUE IOHEXOL 300 MG/ML  SOLN COMPARISON:  None. FINDINGS: Osseous: No fracture or mandibular dislocation. Carious disease involving the left upper third molar (5:31). Orbits: Inferior left periorbital soft tissue swelling. Orbits are otherwise unremarkable. Sinuses: Minimal left maxillary sinus mucosal thickening. Soft tissues: Diffuse left cheek soft tissue swelling. There is thickening of the left cheek dermis with a focal cutaneous defect (3:43). Subcutaneous focus of phlegmon measuring 2.1 x 0.9 cm (3:39). Inflammatory changes extend to involve the inferior left periorbital soft tissues. Minimal extension of inflammation to the left retromaxillary space. No laryngeal swelling or narrowing of the airway. Reactive prominence of the left buccal fat. Prominent level 1 nodes are reactive. Limited intracranial: No significant or unexpected finding. IMPRESSION: Left cheek cellulitis with 2.1 cm focal phlegmon. No discrete walled-off fluid collection or tract. Left upper third molar carious disease, however inflammation is less likely to be odontogenic in origin. Electronically Signed   By: Stana Bunting M.D.   On: 05/14/2020 12:50    Review of Systems Blood pressure 130/77, pulse 86, temperature 98.2 F (36.8 C), temperature source Oral, resp. rate 16, height 5\' 4"  (1.626 m), weight 49.9 kg, SpO2 100 %, currently breastfeeding. Physical Exam  Left facial swelling without fluctuance or drainage.  There is a head over the malar prominence which is likely the source of the infection. Ears are normal Neck with cervical adenopathy but no fluctuance mass or evidence  of an abscess Cranial nerves intact Eyes normal Chest clear Oral cavity normal without swelling  CT scan -patient has cellulitis involving the left face distinct from the underlying parotid gland and involving the subcutaneous tissues -no evidence of fluid collection or abscess  Assessment/Plan:  Facial cellulitis  This patient does not  have an abscess or other surgical issue at this time.  It is early in her process and an abscess may develop.  Consideration should be given to repeating her CT scan if she fails to improve or if symptoms worsen.  I would recommend clindamycin for primary treatment.  Rejeana Brock 05/14/2020, 2:23 PM

## 2020-05-14 NOTE — ED Triage Notes (Signed)
Pt reports facial swelling began yesterday morning with major increase since 8pm last night. Face feels hard and hot. Tried hot compress to no relief. Pt scratched center mark, which secreted clear fluid, after that swelling intensified. Throat 'feels funny' since waking at 6am. Some difficulty swallowing. Denies itching, difficulty breathing. Does not recall feeling a bite.   97.5
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
## 2020-05-21 ENCOUNTER — Inpatient Hospital Stay (INDEPENDENT_AMBULATORY_CARE_PROVIDER_SITE_OTHER): Payer: Medicaid Other | Admitting: Primary Care

## 2021-11-06 IMAGING — CT CT MAXILLOFACIAL W/ CM
3 of 4 series · 14 of 47 positions shown, 16 images · IV contrast (omnipaque)
Comparison: None.

CLINICAL DATA: Maxillary/facial abscess.

EXAM:
CT MAXILLOFACIAL WITH CONTRAST
TECHNIQUE: Multidetector CT imaging of the maxillofacial structures was
performed with intravenous contrast. Multiplanar CT image
reconstructions were also generated.
CONTRAST:  75mL OMNIPAQUE IOHEXOL 300 MG/ML  SOLN

[Series 3: max soft · axial · 0.32mm/px · z∈[-206,-72]mm · 8 of 79 slices shown, 10 images]
[im 6/79  brain]
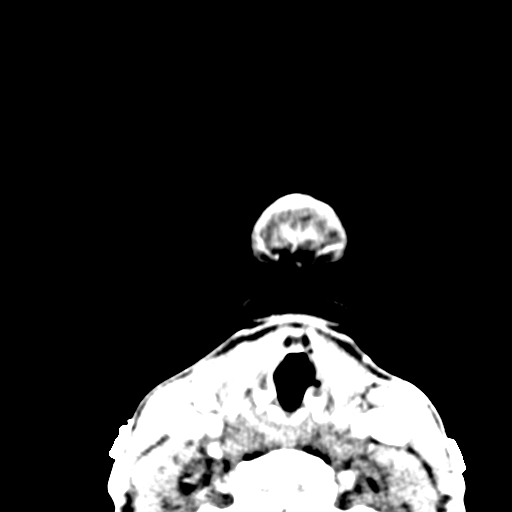
[im 6/79  bone]
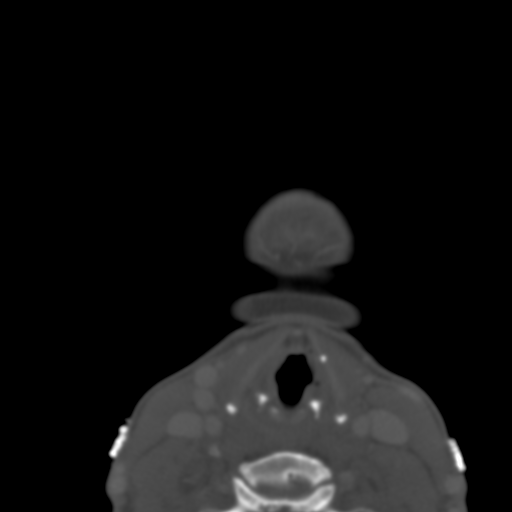
[im 17/79  bone]
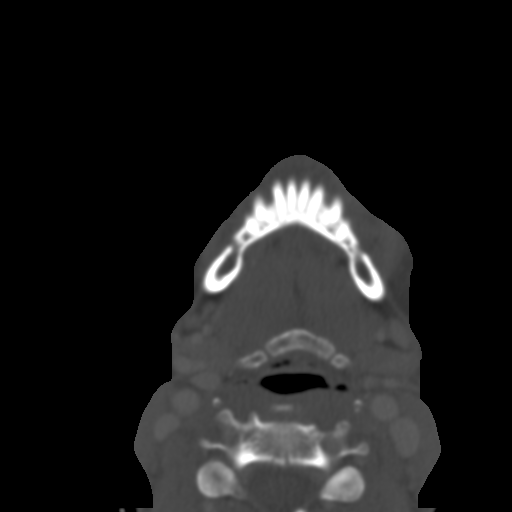
[im 25/79  bone]
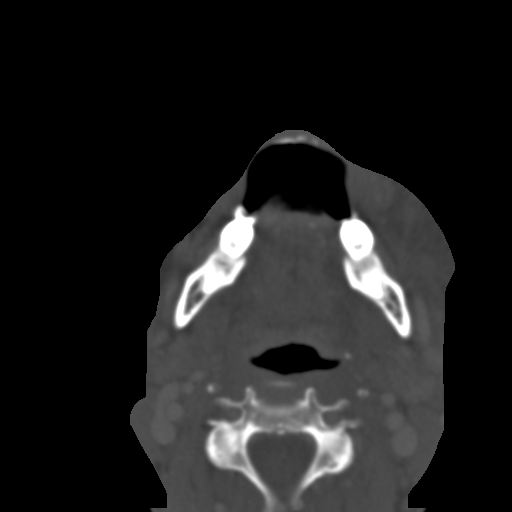
[im 35/79  bone]
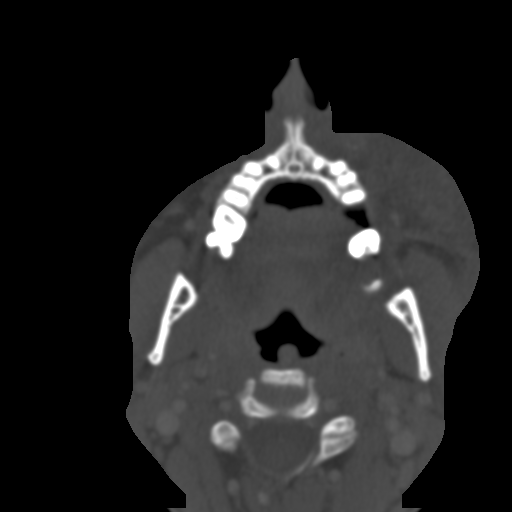
[im 44/79  brain]
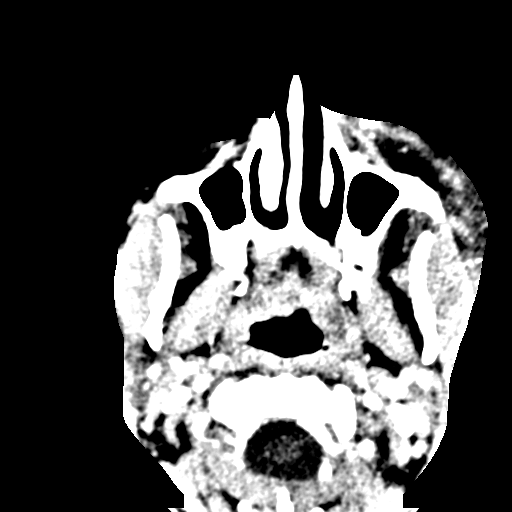
[im 44/79  bone]
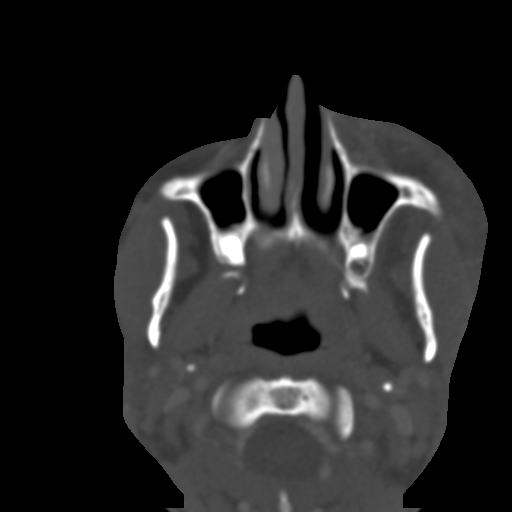
[im 54/79  bone]
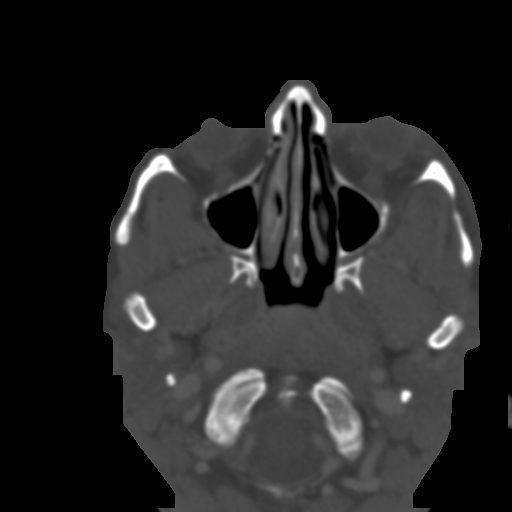
[im 62/79  bone]
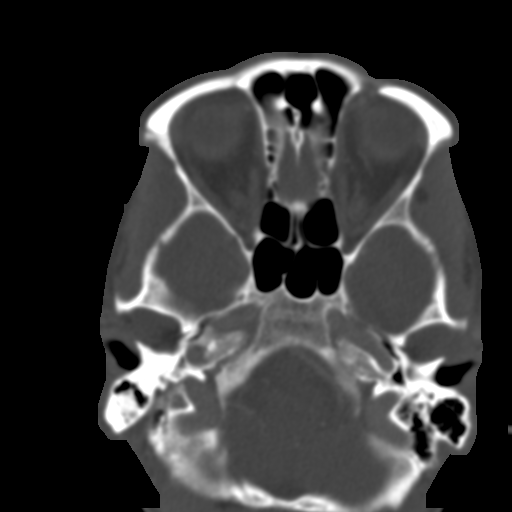
[im 73/79  bone]
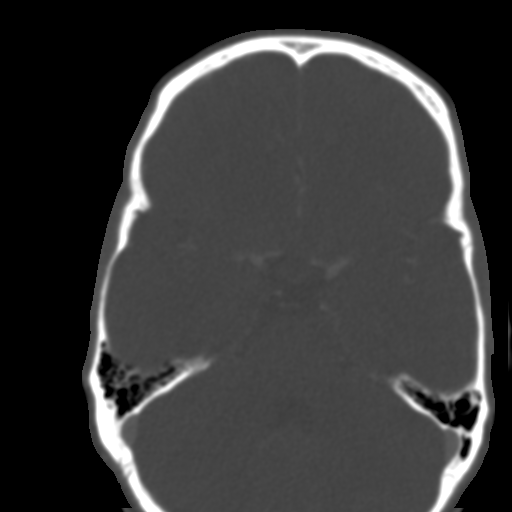

[Series 5: coronal bone · coronal · 0.36mm/px · 3 of 57 slices shown]
[im 15/57  bone]
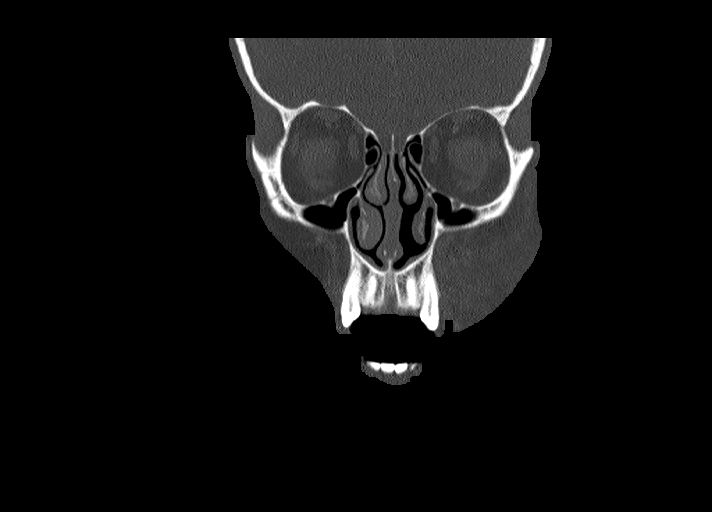
[im 29/57  bone]
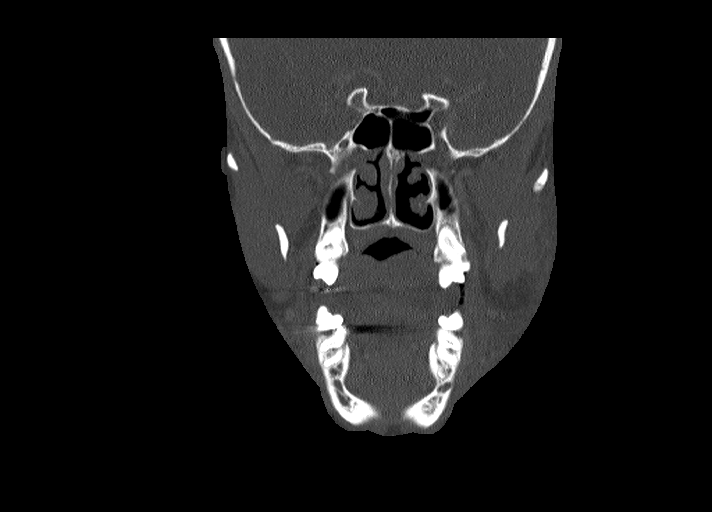
[im 43/57  bone]
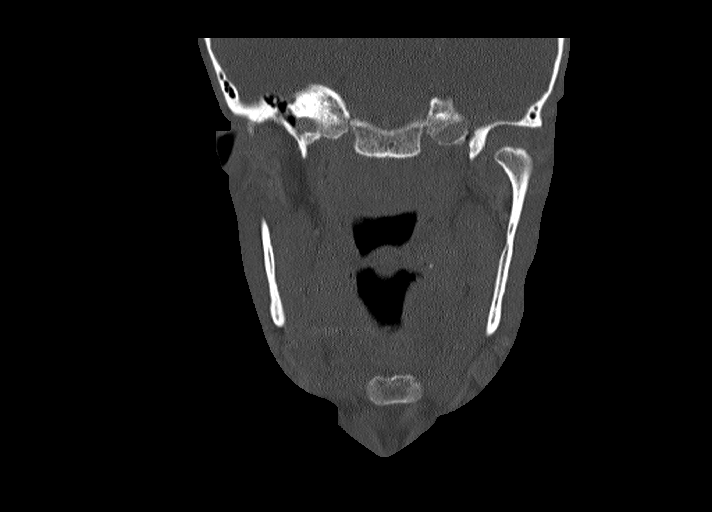

[Series 9: sagittal soft · sagittal · 0.26mm/px · 3 of 87 slices shown]
[im 29/87  bone]
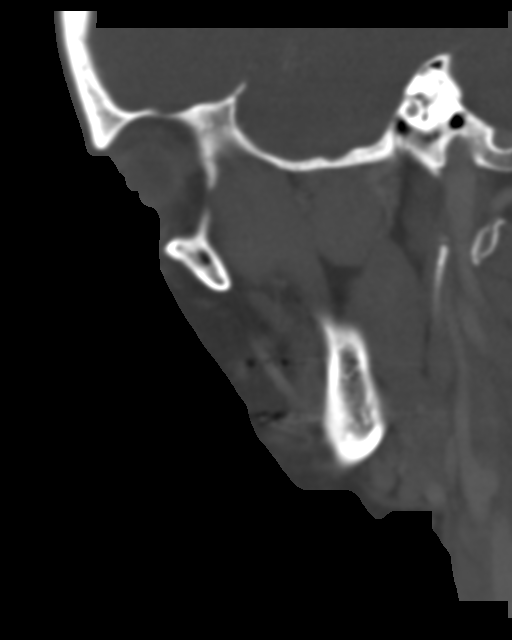
[im 44/87  bone]
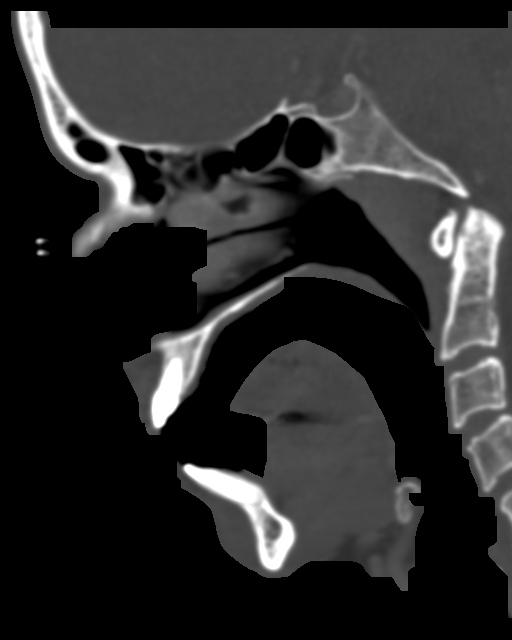
[im 58/87  bone]
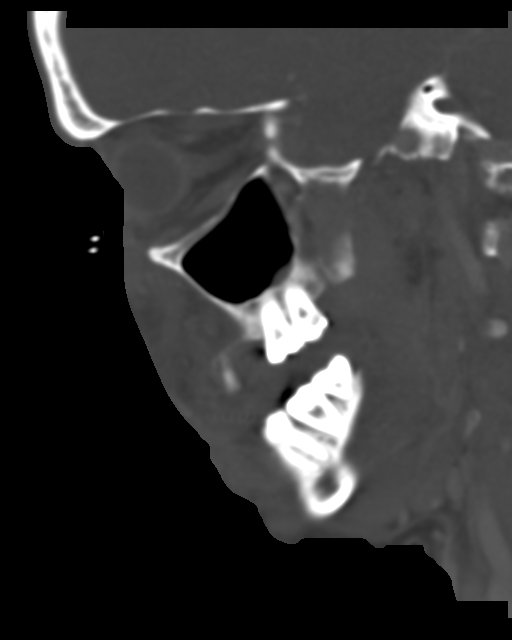

[14 of 47 positions shown; findings below may reference images not displayed]

FINDINGS: Osseous: No fracture or mandibular dislocation. Carious disease
involving the left upper third molar ([DATE]).

Orbits: Inferior left periorbital soft tissue swelling. Orbits are
otherwise unremarkable.

Sinuses: Minimal left maxillary sinus mucosal thickening.

Soft tissues: Diffuse left cheek soft tissue swelling. There is
thickening of the left cheek dermis with a focal cutaneous defect
([DATE]). Subcutaneous focus of phlegmon measuring 2.1 x 0.9 cm
([DATE]). Inflammatory changes extend to involve the inferior left
periorbital soft tissues.

Minimal extension of inflammation to the left retromaxillary space.
No laryngeal swelling or narrowing of the airway. Reactive
prominence of the left buccal fat.

Prominent level 1 nodes are reactive.

Limited intracranial: No significant or unexpected finding.
IMPRESSION: Left cheek cellulitis with 2.1 cm focal phlegmon. No discrete
walled-off fluid collection or tract.

Left upper third molar carious disease, however inflammation is less
likely to be odontogenic in origin.
